# Patient Record
Sex: Male | Born: 1945 | Race: White | Hispanic: No | Marital: Married | Smoking: Former smoker
Health system: Southern US, Community
[De-identification: ages and names within clinical notes are randomized; demographics above are authoritative.]

## PROBLEM LIST (undated history)

## (undated) DIAGNOSIS — E785 Hyperlipidemia, unspecified: Secondary | ICD-10-CM

## (undated) DIAGNOSIS — K219 Gastro-esophageal reflux disease without esophagitis: Secondary | ICD-10-CM

## (undated) DIAGNOSIS — K4090 Unilateral inguinal hernia, without obstruction or gangrene, not specified as recurrent: Secondary | ICD-10-CM

## (undated) DIAGNOSIS — R001 Bradycardia, unspecified: Secondary | ICD-10-CM

## (undated) DIAGNOSIS — T7840XA Allergy, unspecified, initial encounter: Secondary | ICD-10-CM

## (undated) HISTORY — DX: Unilateral inguinal hernia, without obstruction or gangrene, not specified as recurrent: K40.90

## (undated) HISTORY — DX: Hyperlipidemia, unspecified: E78.5

## (undated) HISTORY — DX: Bradycardia, unspecified: R00.1

## (undated) HISTORY — DX: Allergy, unspecified, initial encounter: T78.40XA

## (undated) HISTORY — DX: Gastro-esophageal reflux disease without esophagitis: K21.9

## (undated) HISTORY — PX: HERNIA REPAIR: SHX51

---
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
## 1967-08-13 HISTORY — PX: TONSILLECTOMY: SUR1361

## 2007-07-30 ENCOUNTER — Encounter: Payer: Self-pay | Admitting: Pulmonary Disease

## 2008-03-04 ENCOUNTER — Encounter: Payer: Self-pay | Admitting: Pulmonary Disease

## 2008-03-10 ENCOUNTER — Encounter: Admission: RE | Admit: 2008-03-10 | Discharge: 2008-03-10 | Payer: Self-pay | Admitting: Family Medicine

## 2008-03-10 ENCOUNTER — Encounter: Payer: Self-pay | Admitting: Pulmonary Disease

## 2008-03-24 ENCOUNTER — Ambulatory Visit: Payer: Self-pay | Admitting: Pulmonary Disease

## 2008-03-24 DIAGNOSIS — R0602 Shortness of breath: Secondary | ICD-10-CM

## 2008-03-24 DIAGNOSIS — R05 Cough: Secondary | ICD-10-CM | POA: Insufficient documentation

## 2008-03-25 ENCOUNTER — Telehealth (INDEPENDENT_AMBULATORY_CARE_PROVIDER_SITE_OTHER): Payer: Self-pay | Admitting: *Deleted

## 2008-03-25 DIAGNOSIS — J309 Allergic rhinitis, unspecified: Secondary | ICD-10-CM | POA: Insufficient documentation

## 2008-03-25 DIAGNOSIS — E785 Hyperlipidemia, unspecified: Secondary | ICD-10-CM

## 2008-04-08 ENCOUNTER — Telehealth: Payer: Self-pay | Admitting: Pulmonary Disease

## 2008-04-15 ENCOUNTER — Ambulatory Visit: Payer: Self-pay | Admitting: Pulmonary Disease

## 2008-04-25 ENCOUNTER — Telehealth: Payer: Self-pay | Admitting: Pulmonary Disease

## 2009-11-02 IMAGING — CT CT CHEST W/ CM
2 of 3 series · 16 of 36 positions shown, 20 images · IV contrast (agent unspecified)
Comparison: None.

CLINICAL DATA: 61-year-old male progressive refractory cough

CT CHEST WITH CONTRAST
TECHNIQUE: Multidetector CT imaging of the chest was performed
following the standard protocol during bolus administration of
intravenous contrast.
Contrast: 75 ml [id number]

[Series 2: routine chest · axial · 0.70mm/px · z∈[-289,-14]mm · 13 of 63 slices shown, 17 images]
[im 5/63  mediastinal]
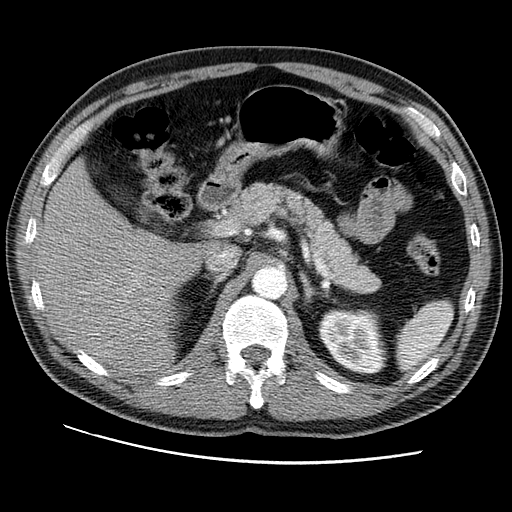
[im 5/63  lung]
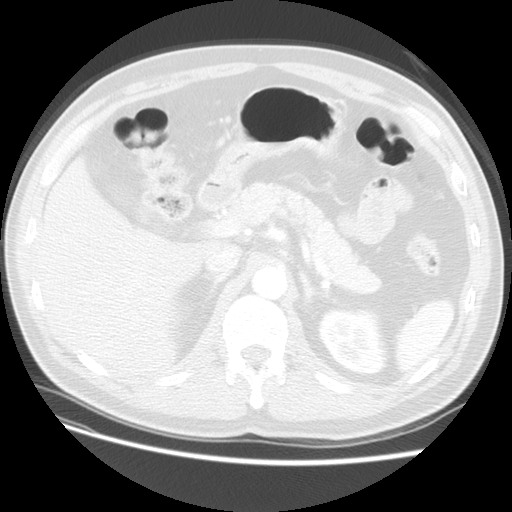
[im 10/63  lung]
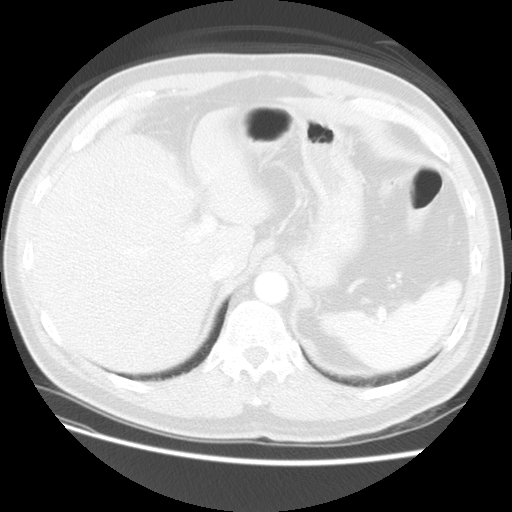
[im 14/63  lung]
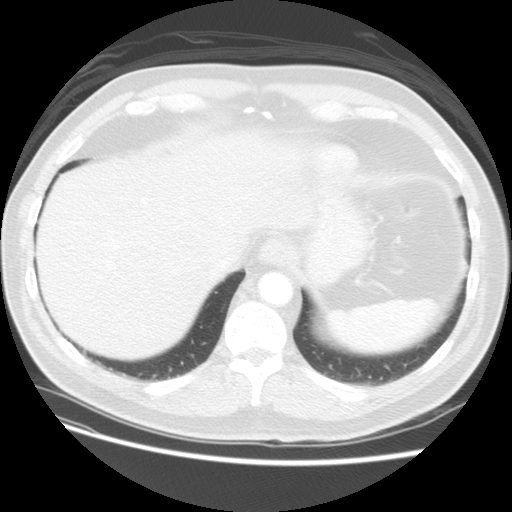
[im 19/63  lung]
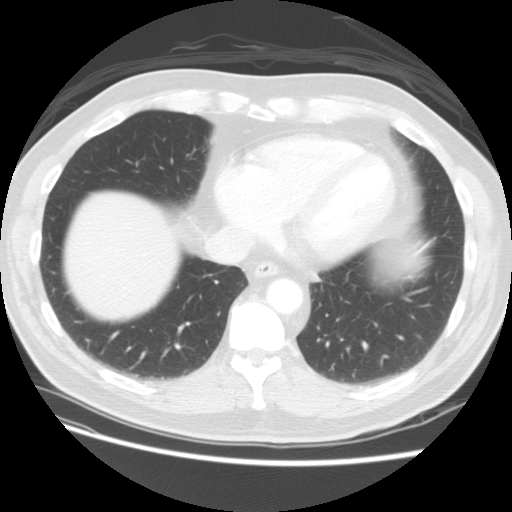
[im 23/63  mediastinal]
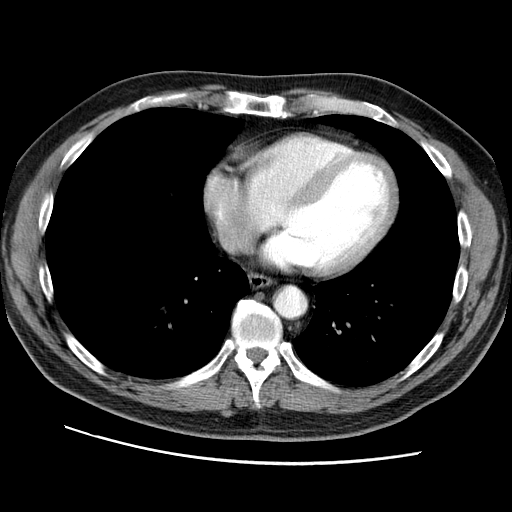
[im 23/63  lung]
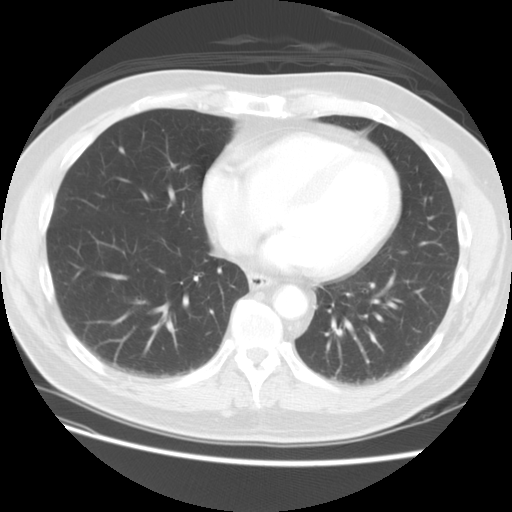
[im 28/63  lung]
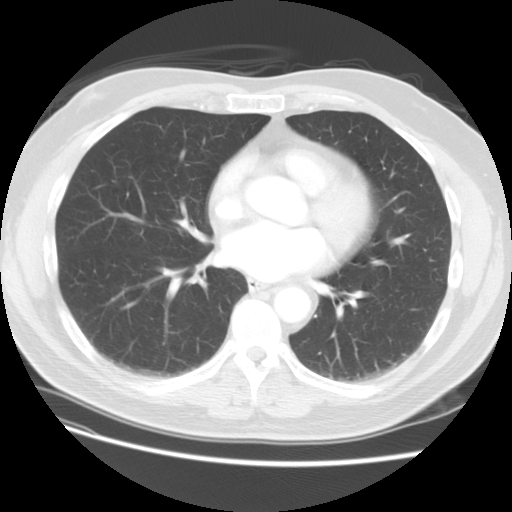
[im 33/63  lung]
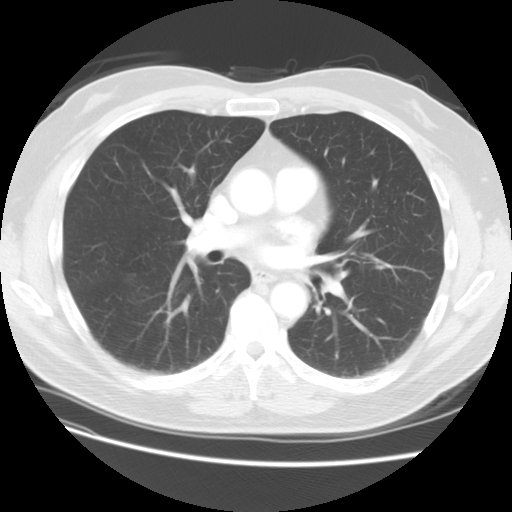
[im 37/63  lung]
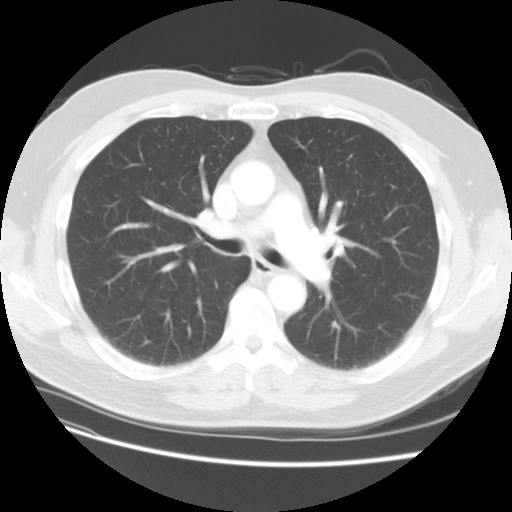
[im 42/63  mediastinal]
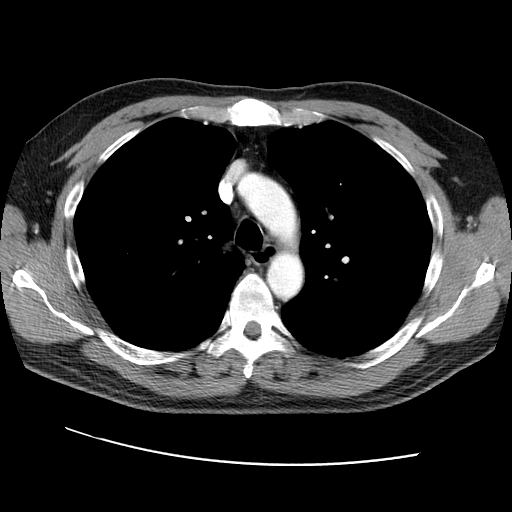
[im 42/63  lung]
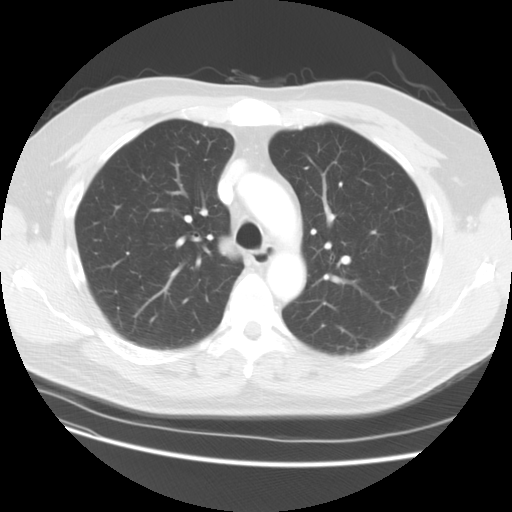
[im 46/63  lung]
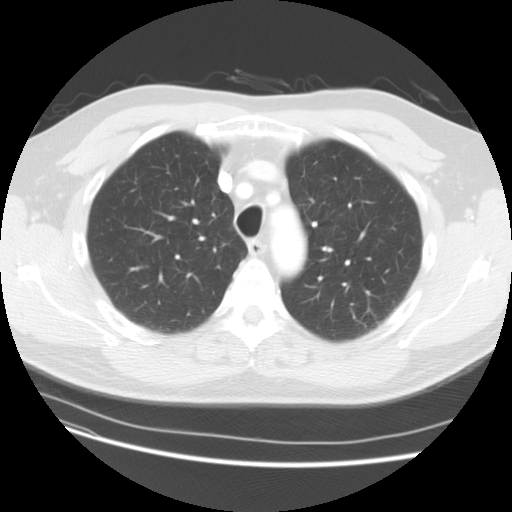
[im 51/63  lung]
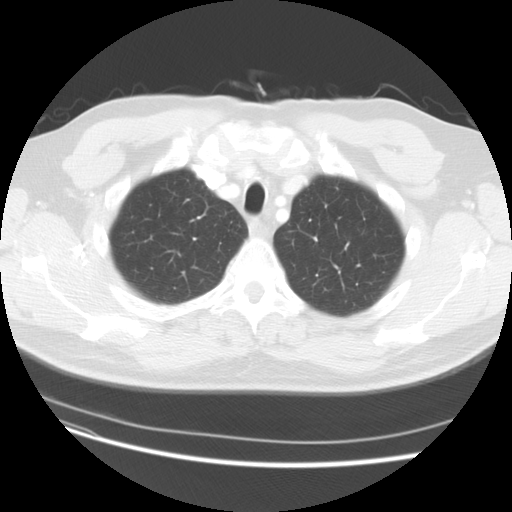
[im 56/63  lung]
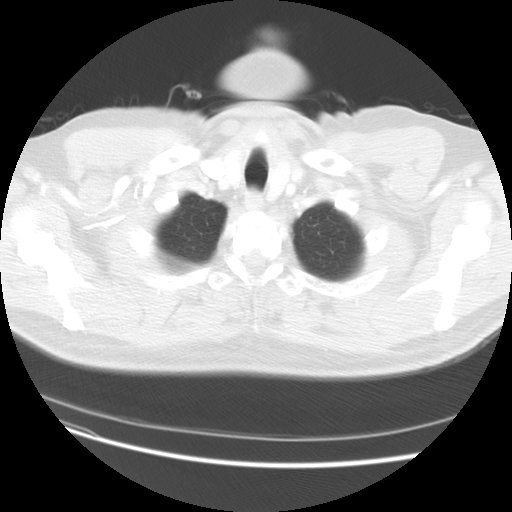
[im 60/63  mediastinal]
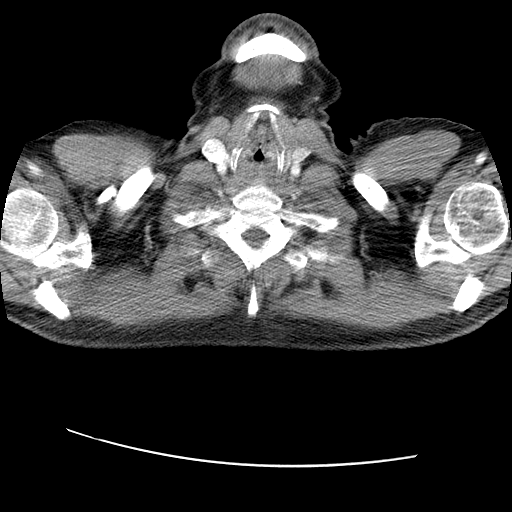
[im 60/63  lung]
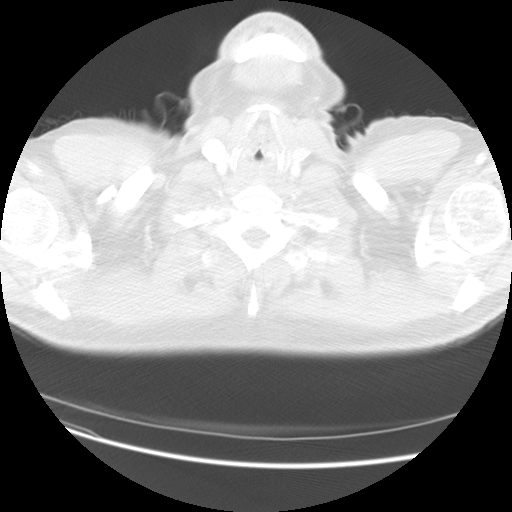

[Series 400: cor · coronal · 0.70mm/px · 3 of 100 slices shown]
[im 20/100  lung]
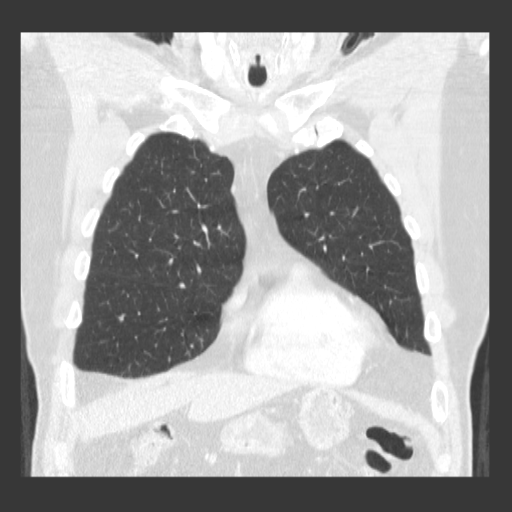
[im 40/100  lung]
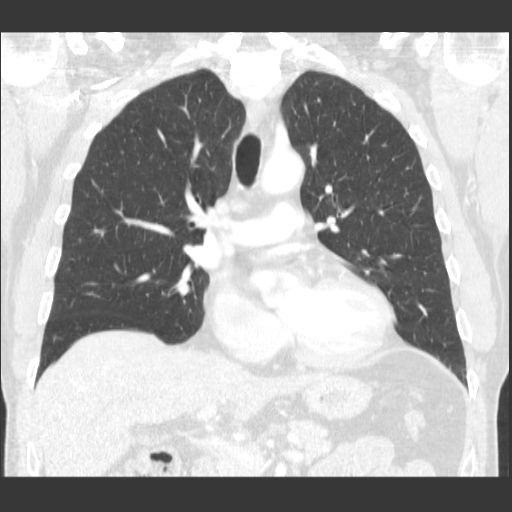
[im 60/100  lung]
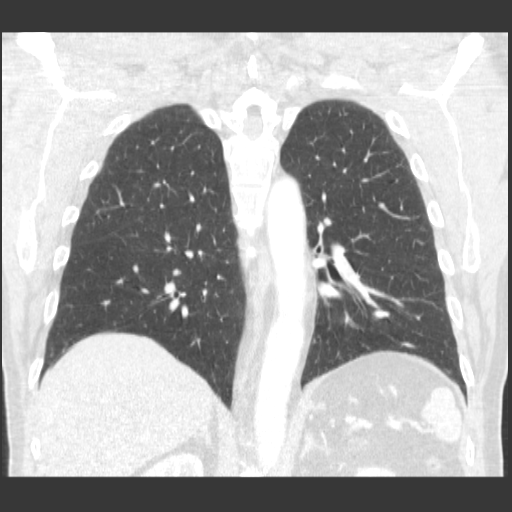

[16 of 36 positions shown; findings below may reference images not displayed]

FINDINGS: No axillary, mediastinal or hilar adenopathy.  Normal
heart size.  No pericardial or pleural effusion.  Intact thoracic
aorta.  No aneurysm or dissection.  Central enhanced pulmonary
arteries are patent.

Included imaging of the upper abdomen demonstrates no acute
finding.

Lung windows reveal no focal consolidation, airspace opacity,
edema, interstitial change, or airway abnormality.
IMPRESSION: No acute chest finding by CT.

## 2010-08-30 ENCOUNTER — Ambulatory Visit (HOSPITAL_COMMUNITY)
Admission: RE | Admit: 2010-08-30 | Discharge: 2010-08-30 | Payer: Self-pay | Source: Home / Self Care | Attending: Internal Medicine | Admitting: Internal Medicine

## 2010-10-24 ENCOUNTER — Ambulatory Visit
Admission: RE | Admit: 2010-10-24 | Discharge: 2010-10-24 | Disposition: A | Discharge status: Home / Self Care | Payer: Self-pay | Source: Ambulatory Visit | Attending: General Surgery | Admitting: General Surgery

## 2010-10-24 ENCOUNTER — Other Ambulatory Visit: Payer: Self-pay | Admitting: General Surgery

## 2010-10-24 DIAGNOSIS — Z01811 Encounter for preprocedural respiratory examination: Secondary | ICD-10-CM

## 2010-12-26 ENCOUNTER — Encounter (INDEPENDENT_AMBULATORY_CARE_PROVIDER_SITE_OTHER): Payer: Self-pay | Admitting: General Surgery

## 2012-04-23 IMAGING — US US MISC SOFT TISSUE
1 series · 14 of 16 positions shown · non-contrast
Comparison: None

CLINICAL DATA: Left inguinal bulge and discomfort, question hernia

LIMITED ULTRASOUND OF ABDOMINAL SOFT TISSUES
TECHNIQUE: Ultrasound examination of the abdominal wall soft
tissues was performed in the area of clinical concern.  I was
present for the study and examined the patient.

[Series 1: us misc soft tissue · 0.07mm/px · 18 acquisitions, 14 frames shown]
[im 1/18]
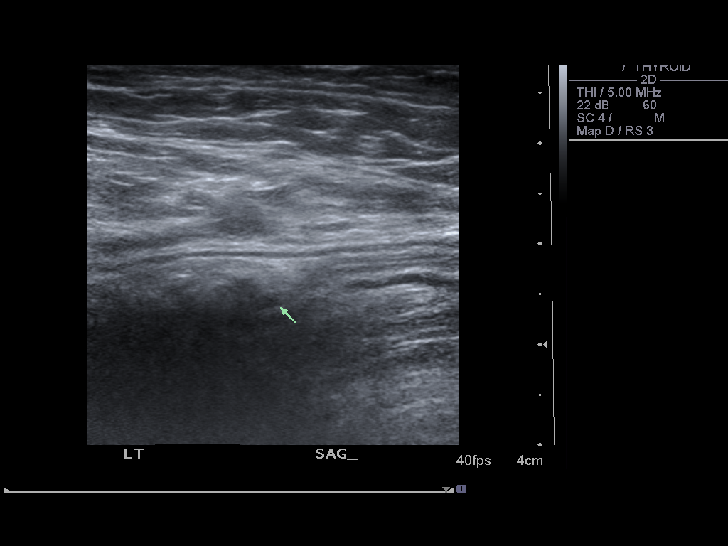
[im 2/18]
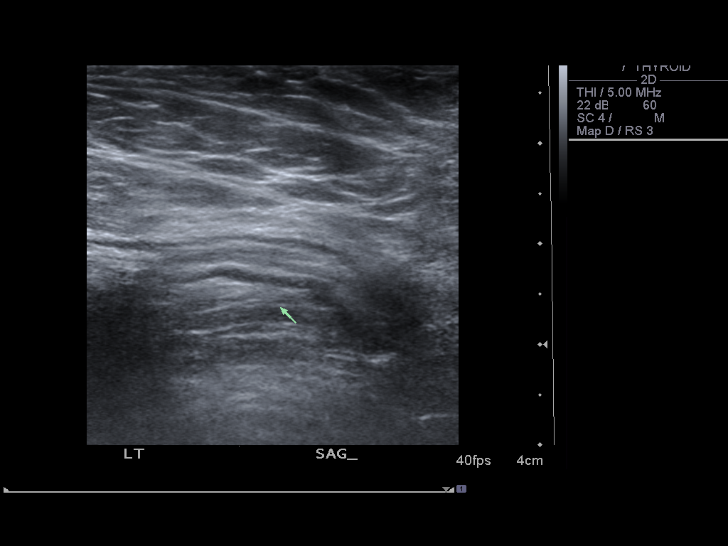
[im 3/18]
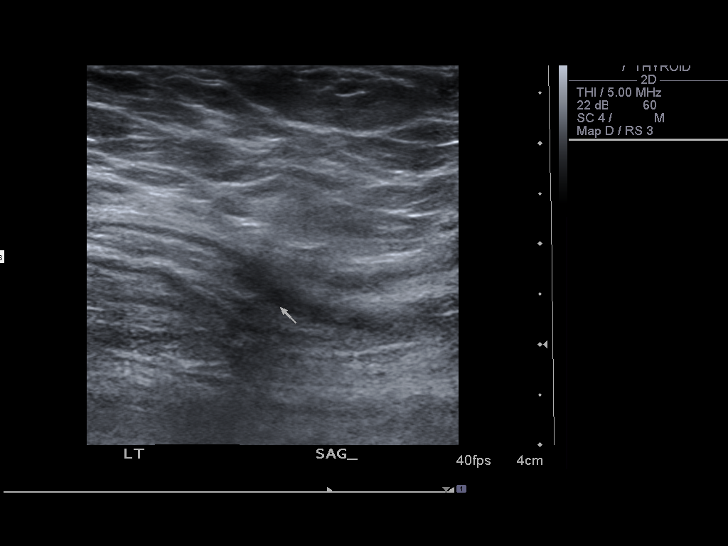
[im 5/18]
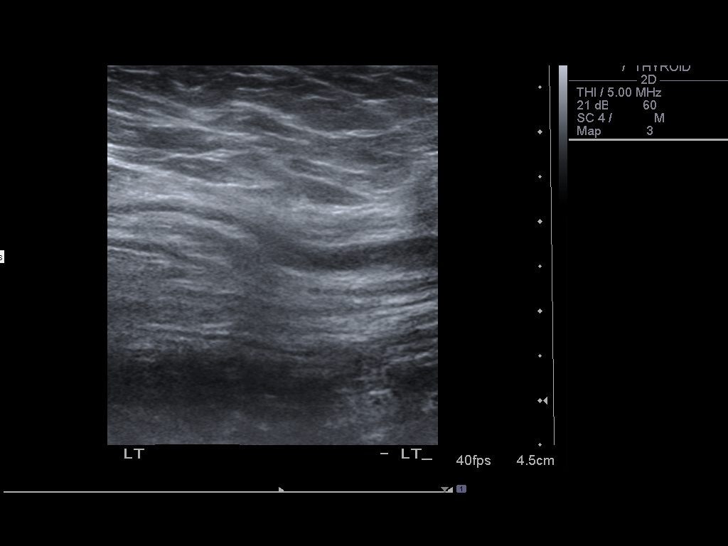
[im 6/18]
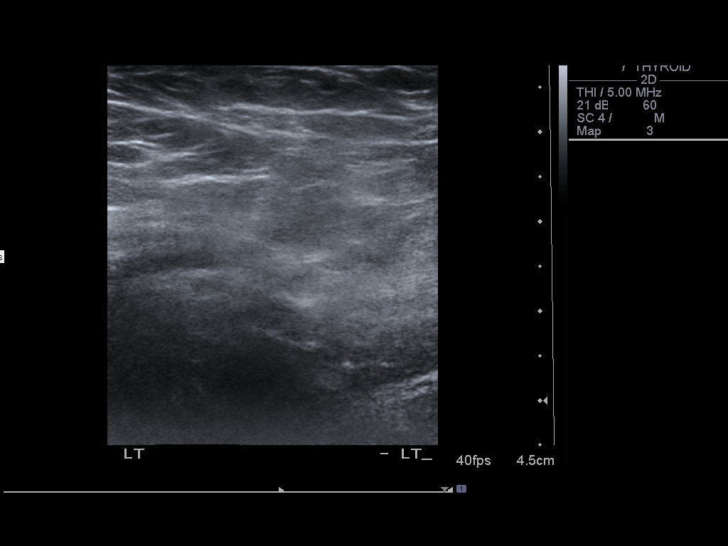
[im 7/18]
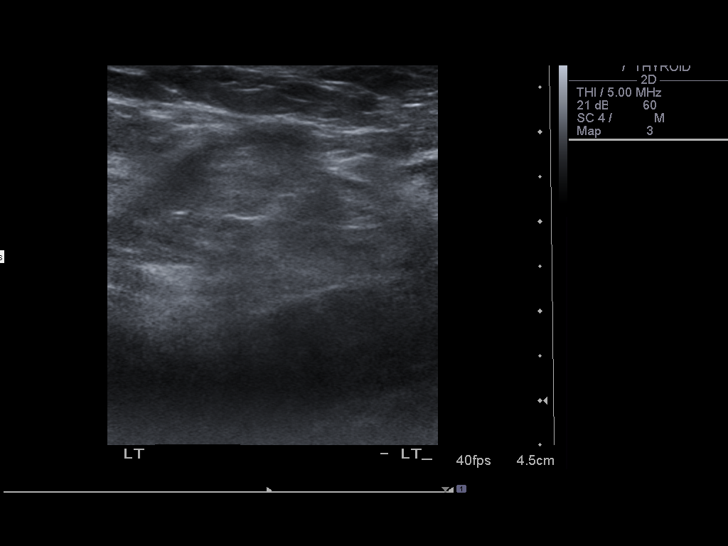
[im 8/18]
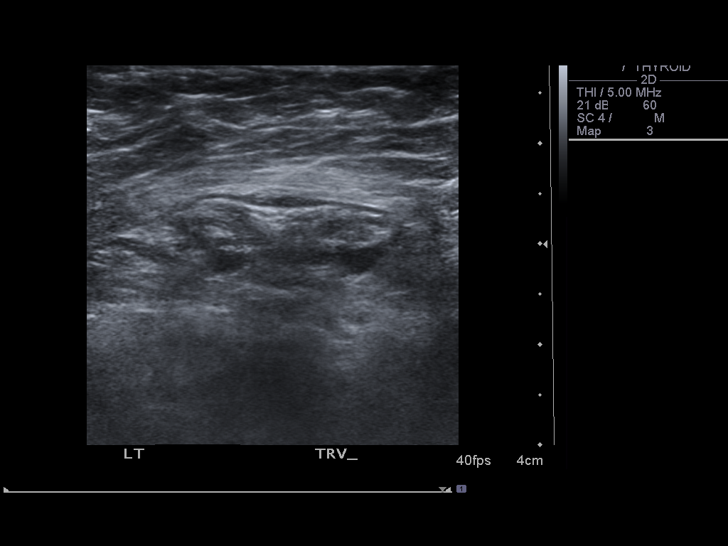
[im 10/18]
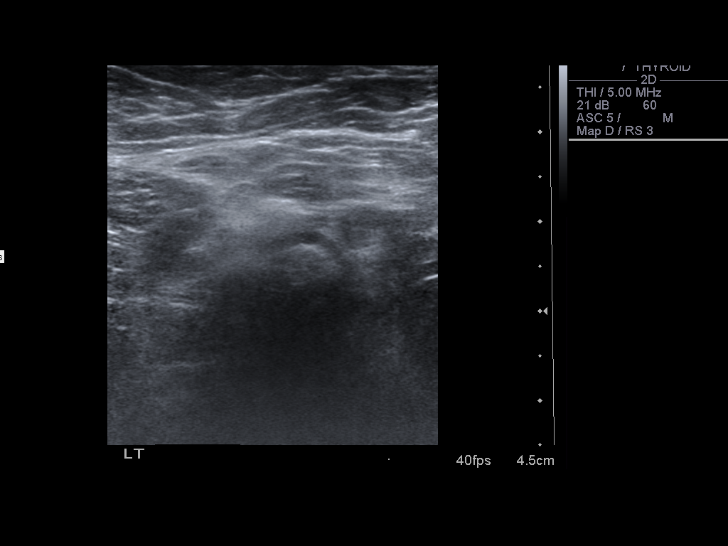
[im 11/18]
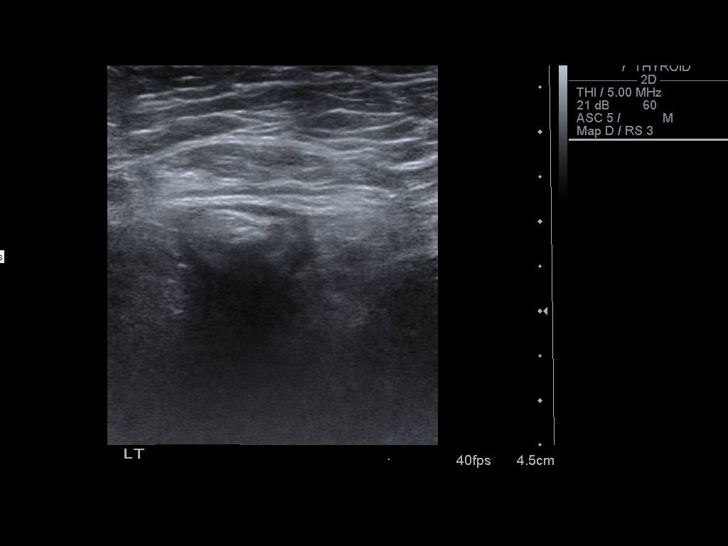
[im 12/18]
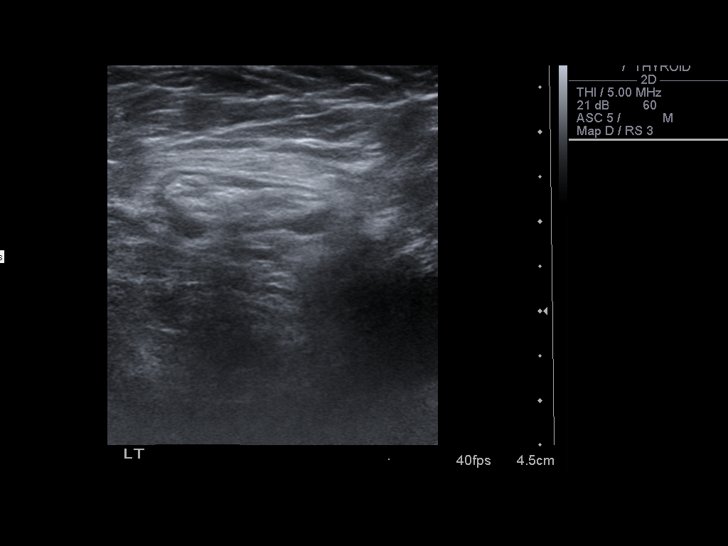
[im 14/18]
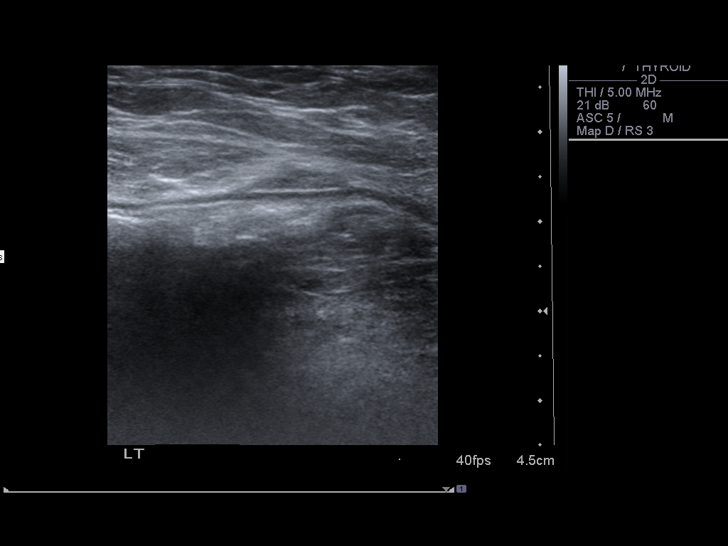
[im 15/18]
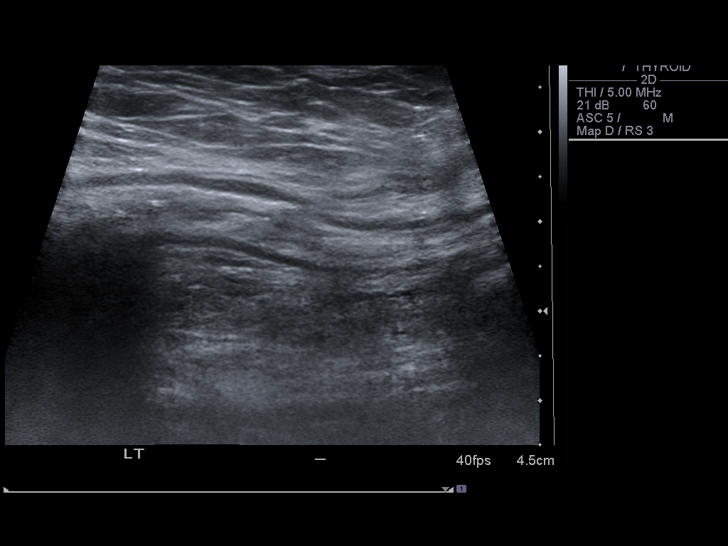
[im 16/18]
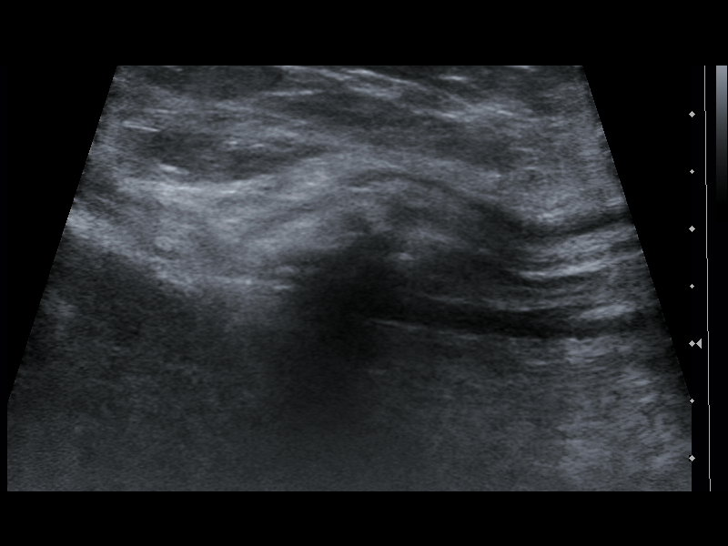
[im 18/18]
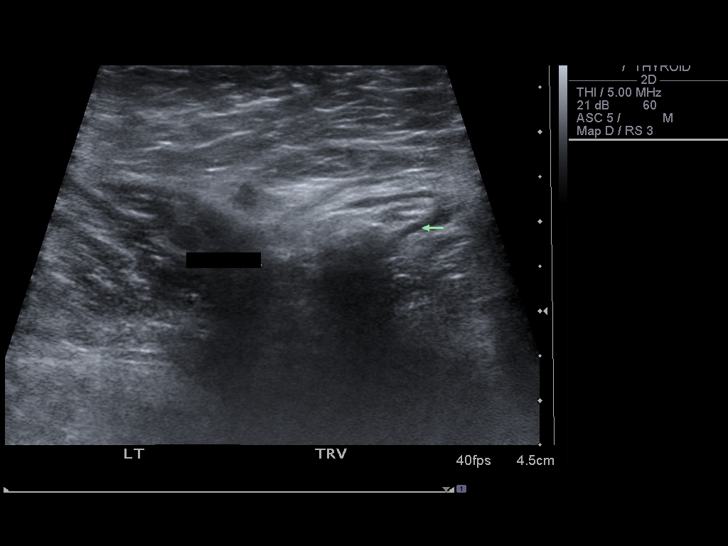

[14 of 16 positions shown; findings below may reference images not displayed]

FINDINGS: At the site of clinical concern, a segment of nonspecific fatty and
echogenicity with linear orientation is identified, corresponding
to the course of the left inguinal canal.
With bearing down, this fat is mobile and descends towards the
scrotum, while remaining soft tissues remain fixed.
This appears to represent an indirect left inguinal hernia.
Indirect left inguinal hernia is present on physical exam.
No bowel herniation identified.
No focal fascial defect or area of soft tissue protrusion is
identified to suggest a direct inguinal hernia.
IMPRESSION: Indirect left inguinal hernia containing fat.

## 2015-10-04 DIAGNOSIS — E782 Mixed hyperlipidemia: Secondary | ICD-10-CM | POA: Diagnosis not present

## 2015-10-04 DIAGNOSIS — Z125 Encounter for screening for malignant neoplasm of prostate: Secondary | ICD-10-CM | POA: Diagnosis not present

## 2015-10-06 DIAGNOSIS — K219 Gastro-esophageal reflux disease without esophagitis: Secondary | ICD-10-CM | POA: Diagnosis not present

## 2015-10-06 DIAGNOSIS — E782 Mixed hyperlipidemia: Secondary | ICD-10-CM | POA: Diagnosis not present

## 2016-01-15 ENCOUNTER — Telehealth: Payer: Self-pay

## 2016-01-15 NOTE — Telephone Encounter (Signed)
[phone]  PLEASE CALL PATIENT TO SCHEDULE TCS,  PCP DR Nevada Crane

## 2016-01-17 ENCOUNTER — Telehealth: Payer: Self-pay

## 2016-01-17 NOTE — Telephone Encounter (Signed)
Triaged pt today.

## 2016-01-23 ENCOUNTER — Other Ambulatory Visit: Payer: Self-pay

## 2016-01-23 DIAGNOSIS — Z1211 Encounter for screening for malignant neoplasm of colon: Secondary | ICD-10-CM

## 2016-01-23 NOTE — Telephone Encounter (Signed)
Gastroenterology Pre-Procedure Review  Request Date: 02/26/2016 Requesting Physician: Dr. Nevada Crane  PATIENT REVIEW QUESTIONS: The patient responded to the following health history questions as indicated:    1. Diabetes Melitis: no 2. Joint replacements in the past 12 months: no 3. Major health problems in the past 3 months: no 4. Has an artificial valve or MVP: no 5. Has a defibrillator: no 6. Has been advised in past to take antibiotics in advance of a procedure like teeth cleaning: no 7. Family history of colon cancer: no  8. Alcohol Use: no 9. History of sleep apnea: no     MEDICATIONS & ALLERGIES:    Patient reports the following regarding taking any blood thinners:   Plavix? no Aspirin? no Coumadin? no  Patient confirms/reports the following medications:  Current Outpatient Prescriptions  Medication Sig Dispense Refill  . Calcium Carbonate-Vitamin D (CALCIUM + D PO) Take by mouth.      Mariane Baumgarten Calcium (STOOL SOFTENER PO) Take by mouth.      . fenofibrate 160 MG tablet Take 160 mg by mouth daily.    . fexofenadine (ALLEGRA) 180 MG tablet Take 180 mg by mouth daily.    . Glucosamine-Chondroitin 1500-1200 MG/30ML LIQD Take by mouth.      Marland Kitchen LYCOPENE PO Take by mouth.    . Multiple Vitamin (MULTIVITAMIN) capsule Take 1 capsule by mouth daily.      . Omega-3 Fatty Acids (FISH OIL) 1000 MG CAPS Take by mouth. Takes 2 tablets daily    . omeprazole (PRILOSEC) 20 MG capsule Take 20 mg by mouth daily. Takes 40 mg once daily    . polycarbophil (FIBERCON) 625 MG tablet Take 625 mg by mouth daily.      . Probiotic Product (PROBIOTIC PO) Take by mouth.      . Ascorbic Acid (VITAMIN C) 1000 MG tablet Take 1,000 mg by mouth daily. Reported on 01/17/2016    . aspirin 81 MG tablet Take 81 mg by mouth daily. Reported on 01/17/2016    . [place] 450 MG CAPS Take by mouth. Reported on 01/17/2016    . niacin 50 MG tablet Take 50 mg by mouth daily with breakfast. Reported on 01/17/2016    .  pravastatin (PRAVACHOL) 20 MG tablet Take 20 mg by mouth daily. Reported on 01/17/2016     No current facility-administered medications for this visit.    Patient confirms/reports the following allergies:  No Known Allergies  No orders of the defined types were placed in this encounter.    AUTHORIZATION INFORMATION Primary Insurance:   ID #:   Group #:  Pre-Cert / Auth required:  Pre-Cert / Auth #:   Secondary Insurance:   ID #:   Group #:  Pre-Cert / Auth required:  Pre-Cert / Auth #:   SCHEDULE INFORMATION: Procedure has been scheduled as follows:  Date: 02/26/2016    Time:  8:30 Am Location:  [facility] Short Stay  This Gastroenterology Pre-Precedure Review Form is being routed to the following provider(s): Barney Drain, MD

## 2016-01-23 NOTE — Telephone Encounter (Signed)
SUPREP SPLIT DOSING- CLEAR LIQUIDS WITH BREAKFAST.  

## 2016-01-25 MED ORDER — PEG 3350-KCL-NA BICARB-NACL 420 G PO SOLR: 4000.0000 mL | ORAL | Status: DC

## 2016-01-25 MED ORDER — NA SULFATE-K SULFATE-MG SULF 17.5-3.13-1.6 GM/177ML PO SOLN: 1.0000 | ORAL | Status: DC

## 2016-01-25 NOTE — Telephone Encounter (Signed)
Suprep too expensive, sending in Martin. Mailing instructions.

## 2016-01-25 NOTE — Telephone Encounter (Signed)
REVIEWED-NO ADDITIONAL RECOMMENDATIONS. 

## 2016-02-15 NOTE — Telephone Encounter (Signed)
No PA is needed pre automated call

## 2016-02-22 ENCOUNTER — Telehealth: Payer: Self-pay

## 2016-02-22 DIAGNOSIS — X32XXXD Exposure to sunlight, subsequent encounter: Secondary | ICD-10-CM | POA: Diagnosis not present

## 2016-02-22 DIAGNOSIS — D485 Neoplasm of uncertain behavior of skin: Secondary | ICD-10-CM | POA: Diagnosis not present

## 2016-02-22 DIAGNOSIS — C44222 Squamous cell carcinoma of skin of right ear and external auricular canal: Secondary | ICD-10-CM | POA: Diagnosis not present

## 2016-02-22 DIAGNOSIS — D225 Melanocytic nevi of trunk: Secondary | ICD-10-CM | POA: Diagnosis not present

## 2016-02-22 DIAGNOSIS — L570 Actinic keratosis: Secondary | ICD-10-CM | POA: Diagnosis not present

## 2016-02-22 DIAGNOSIS — Z1283 Encounter for screening for malignant neoplasm of skin: Secondary | ICD-10-CM | POA: Diagnosis not present

## 2016-02-22 DIAGNOSIS — D2239 Melanocytic nevi of other parts of face: Secondary | ICD-10-CM | POA: Diagnosis not present

## 2016-02-22 NOTE — Telephone Encounter (Signed)
I called pt to update triage and he has not had any change in meds since he was triaged.

## 2016-02-26 ENCOUNTER — Encounter (HOSPITAL_COMMUNITY): Payer: Self-pay | Admitting: *Deleted

## 2016-02-26 ENCOUNTER — Encounter (HOSPITAL_COMMUNITY)
Admission: RE | Disposition: A | Discharge status: Home / Self Care | Payer: Self-pay | Source: Ambulatory Visit | Attending: Gastroenterology

## 2016-02-26 ENCOUNTER — Ambulatory Visit (HOSPITAL_COMMUNITY)
Admission: RE | Admit: 2016-02-26 | Discharge: 2016-02-26 | Disposition: A | Discharge status: Home / Self Care | Payer: PPO | Source: Ambulatory Visit | Attending: Gastroenterology | Admitting: Gastroenterology

## 2016-02-26 DIAGNOSIS — D123 Benign neoplasm of transverse colon: Secondary | ICD-10-CM | POA: Insufficient documentation

## 2016-02-26 DIAGNOSIS — Z1211 Encounter for screening for malignant neoplasm of colon: Secondary | ICD-10-CM | POA: Diagnosis not present

## 2016-02-26 DIAGNOSIS — K648 Other hemorrhoids: Secondary | ICD-10-CM | POA: Diagnosis not present

## 2016-02-26 DIAGNOSIS — Q438 Other specified congenital malformations of intestine: Secondary | ICD-10-CM | POA: Insufficient documentation

## 2016-02-26 DIAGNOSIS — K5730 Diverticulosis of large intestine without perforation or abscess without bleeding: Secondary | ICD-10-CM | POA: Insufficient documentation

## 2016-02-26 DIAGNOSIS — Z87891 Personal history of nicotine dependence: Secondary | ICD-10-CM | POA: Diagnosis not present

## 2016-02-26 HISTORY — PX: COLONOSCOPY: SHX5424

## 2016-02-26 SURGERY — COLONOSCOPY
Anesthesia: Moderate Sedation

## 2016-02-26 MED ORDER — MEPERIDINE HCL 100 MG/ML IJ SOLN
INTRAMUSCULAR | Status: AC
  Filled 2016-02-26: qty 2

## 2016-02-26 MED ORDER — MIDAZOLAM HCL 5 MG/5ML IJ SOLN
INTRAMUSCULAR | Status: AC
  Filled 2016-02-26: qty 10

## 2016-02-26 MED ORDER — MIDAZOLAM HCL 5 MG/5ML IJ SOLN
INTRAMUSCULAR | Status: DC | PRN
  Administered 2016-02-26: 1 mg via INTRAVENOUS
  Administered 2016-02-26 (×2): 2 mg via INTRAVENOUS

## 2016-02-26 MED ORDER — MEPERIDINE HCL 100 MG/ML IJ SOLN
INTRAMUSCULAR | Status: DC | PRN
  Administered 2016-02-26 (×3): 25 mg via INTRAVENOUS

## 2016-02-26 MED ORDER — STERILE WATER FOR IRRIGATION IR SOLN
Status: DC | PRN
  Administered 2016-02-26: 09:00:00

## 2016-02-26 MED ORDER — SODIUM CHLORIDE 0.9 % IV SOLN
INTRAVENOUS | Status: DC
  Administered 2016-02-26: 08:00:00 via INTRAVENOUS

## 2016-02-26 NOTE — Op Note (Signed)
[facility] Patient Name: David Randall Procedure Date: 02/26/2016 8:24 AM MRN: [id number] Date of Birth: 19-May-1946 Attending MD: Barney Drain , MD CSN: [id number] Age: 70 Admit Type: Outpatient Procedure:                Colonoscopy WITH COLD FORCEPS POLYPECTOMY Indications:              Screening for colorectal malignant neoplasm Providers:                Barney Drain, MD, Janeece Riggers, RN, Isabella Stalling,                            Technician Referring MD:             Delphina Cahill, MD Medicines:                Meperidine 75 mg IV, Midazolam 5 mg IV Complications:            No immediate complications. Estimated Blood Loss:     Estimated blood loss was minimal. Procedure:                Pre-Anesthesia Assessment:                           - Prior to the procedure, a History and Physical                            was performed, and patient medications and                            allergies were reviewed. The patient's tolerance of                            previous anesthesia was also reviewed. The risks                            and benefits of the procedure and the sedation                            options and risks were discussed with the patient.                            All questions were answered, and informed consent                            was obtained. Prior Anticoagulants: The patient has                            taken no previous anticoagulant or antiplatelet                            agents. ASA Grade Assessment: II - A patient with                            mild systemic disease. After reviewing the risks  and benefits, the patient was deemed in                            satisfactory condition to undergo the procedure.                           After obtaining informed consent, the colonoscope                            was passed under direct vision. Throughout the                            procedure, the patient's blood  pressure, pulse, and                            oxygen saturations were monitored continuously. The                            [id number]) was introduced through the anus                            and advanced to the the cecum, identified by                            appendiceal orifice and ileocecal valve. The                            colonoscopy was somewhat difficult due to a                            tortuous colon. Successful completion of the                            procedure was aided by COLOWRAP. The patient                            tolerated the procedure well. The quality of the                            bowel preparation was excellent. The ileocecal                            valve, appendiceal orifice, and rectum were                            photographed. Scope In: 8:50:35 AM Scope Out: 9:07:09 AM Scope Withdrawal Time: 0 hours 11 minutes 47 seconds  Total Procedure Duration: 0 hours 16 minutes 34 seconds  Findings:      A 3 mm polyp was found in the proximal transverse colon. The polyp was       sessile. The polyp was removed with a cold biopsy forceps. Resection and       retrieval were complete.      A few small-mouthed diverticula were found in the sigmoid colon and  descending colon.      Non-bleeding external and internal hemorrhoids were found.      The recto-sigmoid colon and sigmoid colon were moderately redundant AND       COLOWRAP Impression:               - One 3 mm polyp in the proximal transverse colon,                            removed with a cold biopsy forceps.                           - Diverticulosis in the sigmoid colon and in the                            descending colon.                           - Non-bleeding LARGE external and SMALL internal                            hemorrhoids.                           - MODERATELY Redundant LEFT colon. Moderate Sedation:      Moderate (conscious) sedation was administered by the  endoscopy nurse       and supervised by the endoscopist. The following parameters were       monitored: oxygen saturation, heart rate, blood pressure, and response       to care. Total physician intraservice time was 31 minutes. Recommendation:           - High fiber diet.                           - Continue present medications.                           - Await pathology results.                           - Repeat colonoscopy in 5-10 years for surveillance.                           PREPARATION H PRN FOR HEMORRHOIDS. RESERVE HCT                            CREAM FOR FLARES. EXPLAINED BENEFITS AND RISKS OF                            LONG TERM HCT CREAM USE. PT AND WIFE VOICED                            UNDERSTANDING.                           - Patient has a contact number available for  emergencies. The signs and symptoms of potential                            delayed complications were discussed with the                            patient. Return to normal activities tomorrow.                            Written discharge instructions were provided to the                            patient. Procedure Code(s):        --- Professional ---                           [phone], Colonoscopy, flexible; with biopsy, single                            or multiple                           99152, Moderate sedation services provided by the                            same physician or other qualified health care                            professional performing the diagnostic or                            therapeutic service that the sedation supports,                            requiring the presence of an independent trained                            observer to assist in the monitoring of the                            patient's level of consciousness and physiological                            status; initial 15 minutes of intraservice time,                             patient age 64 years or older                           [id number], Moderate sedation services; each additional                            15 minutes intraservice time Diagnosis Code(s):        --- Professional ---  Z12.11, Encounter for screening for malignant                            neoplasm of colon                           D12.3, Benign neoplasm of transverse colon (hepatic                            flexure or splenic flexure)                           K64.8, Other hemorrhoids                           K57.30, Diverticulosis of large intestine without                            perforation or abscess without bleeding                           Q43.8, Other specified congenital malformations of                            intestine CPT copyright 2016 [employer]. All rights reserved. The codes documented in this report are preliminary and upon coder review may  be revised to meet current compliance requirements. Barney Drain, MD Barney Drain, MD 02/26/2016 9:47:23 AM This report has been signed electronically. Number of Addenda: 0

## 2016-02-26 NOTE — Discharge Instructions (Signed)
You have small internal AND LARGE EXTERNAL hemorrhoids and diverticulosis IN YOUR LEFT COLON. YOU HAD ONE SMALL POLYP REMOVED FROM YOUR RIGHT COLON.   DRINK WATER TO KEEP YOUR URINE LIGHT YELLOW.  FOLLOW A HIGH FIBER DIET. AVOID ITEMS THAT CAUSE BLOATING. See info below.  YOUR BIOPSY RESULTS WILL BE AVAILABLE IN MY CHART JUL 20 AND  OR MY OFFICE WILL CONTACT YOU IN 10-14 DAYS WITH YOUR RESULTS.   Next colonoscopy in 5-10 years.  Colonoscopy Care After Read the instructions outlined below and refer to this sheet in the next week. These discharge instructions provide you with general information on caring for yourself after you leave the hospital. While your treatment has been planned according to the most current medical practices available, unavoidable complications occasionally occur. If you have any problems or questions after discharge, call DR. Kensley Valladares, [phone].  ACTIVITY  You may resume your regular activity, but move at a slower pace for the next 24 hours.   Take frequent rest periods for the next 24 hours.   Walking will help get rid of the air and reduce the bloated feeling in your belly (abdomen).   No driving for 24 hours (because of the medicine (anesthesia) used during the test).   You may shower.   Do not sign any important legal documents or operate any machinery for 24 hours (because of the anesthesia used during the test).    NUTRITION  Drink plenty of fluids.   You may resume your normal diet as instructed by your doctor.   Begin with a light meal and progress to your normal diet. Heavy or fried foods are harder to digest and may make you feel sick to your stomach (nauseated).   Avoid alcoholic beverages for 24 hours or as instructed.    MEDICATIONS  You may resume your normal medications.   WHAT YOU CAN EXPECT TODAY  Some feelings of bloating in the abdomen.   Passage of more gas than usual.   Spotting of blood in your stool or on the toilet  paper  .  IF YOU HAD POLYPS REMOVED DURING THE COLONOSCOPY:  Eat a soft diet IF YOU HAVE NAUSEA, BLOATING, ABDOMINAL PAIN, OR VOMITING.    FINDING OUT THE RESULTS OF YOUR TEST Not all test results are available during your visit. DR. Oneida Alar WILL CALL YOU WITHIN 14 DAYS OF YOUR PROCEDUE WITH YOUR RESULTS. Do not assume everything is normal if you have not heard from DR. Tyquon Near, CALL HER OFFICE AT [phone].  SEEK IMMEDIATE MEDICAL ATTENTION AND CALL THE OFFICE: [phone] IF:  You have more than a spotting of blood in your stool.   Your belly is swollen (abdominal distention).   You are nauseated or vomiting.   You have a temperature over 101F.   You have abdominal pain or discomfort that is severe or gets worse throughout the day.  High-Fiber Diet A high-fiber diet changes your normal diet to include more whole grains, legumes, fruits, and vegetables. Changes in the diet involve replacing refined carbohydrates with unrefined foods. The calorie level of the diet is essentially unchanged. The Dietary Reference Intake (recommended amount) for adult males is 38 grams per day. For adult females, it is 25 grams per day. Pregnant and lactating women should consume 28 grams of fiber per day. Fiber is the intact part of a plant that is not broken down during digestion. Functional fiber is fiber that has been isolated from the plant to provide a beneficial effect in  the body. PURPOSE  Increase stool bulk.   Ease and regulate bowel movements.   Lower cholesterol.  REDUCE RISK OF COLON CANCER  INDICATIONS THAT YOU NEED MORE FIBER  Constipation and hemorrhoids.   Uncomplicated diverticulosis (intestine condition) and irritable bowel syndrome.   Weight management.   As a protective measure against hardening of the arteries (atherosclerosis), diabetes, and cancer.   GUIDELINES FOR INCREASING FIBER IN THE DIET  Start adding fiber to the diet slowly. A gradual increase of about 5  more grams (2 slices of whole-wheat bread, 2 servings of most fruits or vegetables, or 1 bowl of high-fiber cereal) per day is best. Too rapid an increase in fiber may result in constipation, flatulence, and bloating.   Drink enough water and fluids to keep your urine clear or pale yellow. Water, juice, or caffeine-free drinks are recommended. Not drinking enough fluid may cause constipation.   Eat a variety of high-fiber foods rather than one type of fiber.   Try to increase your intake of fiber through using high-fiber foods rather than fiber pills or supplements that contain small amounts of fiber.   The goal is to change the types of food eaten. Do not supplement your present diet with high-fiber foods, but replace foods in your present diet.   INCLUDE A VARIETY OF FIBER SOURCES  Replace refined and processed grains with whole grains, canned fruits with fresh fruits, and incorporate other fiber sources. White rice, white breads, and most bakery goods contain little or no fiber.   Brown whole-grain rice, buckwheat oats, and many fruits and vegetables are all good sources of fiber. These include: broccoli, Brussels sprouts, cabbage, cauliflower, beets, sweet potatoes, white potatoes (skin on), carrots, tomatoes, eggplant, squash, berries, fresh fruits, and dried fruits.   Cereals appear to be the richest source of fiber. Cereal fiber is found in whole grains and bran. Bran is the fiber-rich outer coat of cereal grain, which is largely removed in refining. In whole-grain cereals, the bran remains. In breakfast cereals, the largest amount of fiber is found in those with "bran" in their names. The fiber content is sometimes indicated on the label.   You may need to include additional fruits and vegetables each day.   In baking, for 1 cup white flour, you may use the following substitutions:   1 cup whole-wheat flour minus 2 tablespoons.   1/2 cup white flour plus 1/2 cup whole-wheat flour.    Polyps, Colon  A polyp is extra tissue that grows inside your body. Colon polyps grow in the large intestine. The large intestine, also called the colon, is part of your digestive system. It is a long, hollow tube at the end of your digestive tract where your body makes and stores stool. Most polyps are not dangerous. They are benign. This means they are not cancerous. But over time, some types of polyps can turn into cancer. Polyps that are smaller than a pea are usually not harmful. But larger polyps could someday become or may already be cancerous. To be safe, doctors remove all polyps and test them.   PREVENTION There is not one sure way to prevent polyps. You might be able to lower your risk of getting them if you:  Eat more fruits and vegetables and less fatty food.   Do not smoke.   Avoid alcohol.   Exercise every day.   Lose weight if you are overweight.   Eating more calcium and folate can also lower your risk  of getting polyps. Some foods that are rich in calcium are milk, cheese, and broccoli. Some foods that are rich in folate are chickpeas, kidney beans, and spinach.    Diverticulosis Diverticulosis is a common condition that develops when small pouches (diverticula) form in the wall of the colon. The risk of diverticulosis increases with age. It happens more often in people who eat a low-fiber diet. Most individuals with diverticulosis have no symptoms. Those individuals with symptoms usually experience belly (abdominal) pain, constipation, or loose stools (diarrhea).  HOME CARE INSTRUCTIONS  Increase the amount of fiber in your diet as directed by your caregiver or dietician. This may reduce symptoms of diverticulosis.   Drink at least 6 to 8 glasses of water each day to prevent constipation.   Try not to strain when you have a bowel movement.   Avoiding nuts and seeds to prevent complications is NOT NECESSARY.   FOODS HAVING HIGH FIBER CONTENT INCLUDE:  Fruits.  Apple, peach, pear, tangerine, raisins, prunes.   Vegetables. Brussels sprouts, asparagus, broccoli, cabbage, carrot, cauliflower, romaine lettuce, spinach, summer squash, tomato, winter squash, zucchini.   Starchy Vegetables. Baked beans, kidney beans, lima beans, split peas, lentils, potatoes (with skin).   Grains. Whole wheat bread, brown rice, bran flake cereal, plain oatmeal, white rice, shredded wheat, bran muffins.    SEEK IMMEDIATE MEDICAL CARE IF:  You develop increasing pain or severe bloating.   You have an oral temperature above 101F.   You develop vomiting or bowel movements that are bloody or black.   Hemorrhoids Hemorrhoids are dilated (enlarged) veins around the rectum. Sometimes clots will form in the veins. This makes them swollen and painful. These are called thrombosed hemorrhoids. Causes of hemorrhoids include:  Constipation.   Straining to have a bowel movement.   HEAVY LIFTING  HOME CARE INSTRUCTIONS  Eat a well balanced diet and drink 6 to 8 glasses of water every day to avoid constipation. You may also use a bulk laxative.   Avoid straining to have bowel movements.   Keep anal area dry and clean.   Do not use a donut shaped pillow or sit on the toilet for long periods. This increases blood pooling and pain.   Move your bowels when your body has the urge; this will require less straining and will decrease pain and pressure.

## 2016-02-26 NOTE — H&P (Signed)
Primary Care Physician:  Wende Neighbors, MD Primary Gastroenterologist:  Dr. Oneida Alar  Pre-Procedure History & Physical: HPI:  David Randall is a 70 y.o. male here for [place].  Past Medical History  Diagnosis Date  . Acid reflux   . Low HDL (under 40)   . Hernia   . Hemorrhoids     Past Surgical History  Procedure Laterality Date  . Tonsillectomy  1969  . Hernia repair      Prior to Admission medications   Medication Sig Start Date End Date Taking? Authorizing Provider  Ascorbic Acid (VITAMIN C) 1000 MG tablet Take 1,000 mg by mouth daily. Reported on 01/17/2016   Yes Historical Provider, MD  Calcium Carbonate-Vitamin D (CALCIUM + D PO) Take by mouth.     Yes Historical Provider, MD  Cascara Sagrada 450 MG CAPS Take by mouth. Reported on 01/17/2016   Yes Historical Provider, MD  Docusate Calcium (STOOL SOFTENER PO) Take by mouth.     Yes Historical Provider, MD  fenofibrate 160 MG tablet Take 160 mg by mouth daily.   Yes Historical Provider, MD  fexofenadine (ALLEGRA) 180 MG tablet Take 180 mg by mouth daily.   Yes Historical Provider, MD  Glucosamine-Chondroitin 1500-1200 MG/30ML LIQD Take by mouth.     Yes Historical Provider, MD  LYCOPENE PO Take by mouth.   Yes Historical Provider, MD  Multiple Vitamin (MULTIVITAMIN) capsule Take 1 capsule by mouth daily.     Yes Historical Provider, MD  Na Sulfate-K Sulfate-Mg Sulf (SUPREP BOWEL PREP KIT) 17.5-3.13-1.6 GM/180ML SOLN Take 1 kit by mouth as directed. 01/25/16  Yes Danie Binder, MD  Omega-3 Fatty Acids (FISH OIL) 1000 MG CAPS Take by mouth. Takes 2 tablets daily   Yes Historical Provider, MD  omeprazole (PRILOSEC) 20 MG capsule Take 20 mg by mouth daily. Takes 40 mg once daily   Yes Historical Provider, MD  polycarbophil (FIBERCON) 625 MG tablet Take 625 mg by mouth daily.     Yes Historical Provider, MD  polyethylene glycol-electrolytes (TRILYTE) 420 g solution Take 4,000 mLs by mouth as directed. 01/25/16  Yes Danie Binder, MD  Probiotic Product (PROBIOTIC PO) Take by mouth.     Yes Historical Provider, MD    Allergies as of 01/23/2016  . (No Known Allergies)    Family History  Problem Relation Age of Onset  . Cancer Mother   . Heart disease Father     Social History   Social History  . Marital Status: Married    Spouse Name: N/A  . Number of Children: N/A  . Years of Education: N/A   Occupational History  . Not on file.   Social History Main Topics  . Smoking status: Former Smoker    Types: Pipe    Quit date: 08/12/1984  . Smokeless tobacco: Never Used  . Alcohol Use: No  . Drug Use: No  . Sexual Activity: Not on file   Other Topics Concern  . Not on file   Social History Narrative    Review of Systems: See HPI, otherwise negative ROS   Physical Exam: BP 120/72 mmHg  Pulse 49  Temp(Src) 97.8 F (36.6 C) (Oral)  Resp 16  Ht '5\' 7"'$  (1.702 m)  Wt 164 lb (74.39 kg)  BMI 25.68 kg/m2  SpO2 100% General:   Alert,  pleasant and cooperative in NAD Head:  Normocephalic and atraumatic. Neck:  Supple; Lungs:  Clear throughout to auscultation.    Heart:  Regular rate and rhythm. Abdomen:  Soft, nontender and nondistended. Normal bowel sounds, without guarding, and without rebound.   Neurologic:  Alert and  oriented x4;  grossly normal neurologically.  Impression/Plan:      SCREENING  Plan:  1. TCS TODAY

## 2016-02-27 NOTE — Telephone Encounter (Signed)
REVIEWED-NO ADDITIONAL RECOMMENDATIONS. 

## 2016-02-28 ENCOUNTER — Encounter (HOSPITAL_COMMUNITY): Payer: Self-pay | Admitting: Gastroenterology

## 2016-03-08 ENCOUNTER — Telehealth: Payer: Self-pay | Admitting: Gastroenterology

## 2016-03-08 NOTE — Telephone Encounter (Signed)
Please call pt. 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 FOLLOW A High fiber diet. NEXT TCS IN 5-10 YEARS. 

## 2016-03-08 NOTE — Telephone Encounter (Signed)
Pt is aware of results. 

## 2016-03-08 NOTE — Telephone Encounter (Signed)
Reminder in epic °

## 2016-04-01 DIAGNOSIS — Z85828 Personal history of other malignant neoplasm of skin: Secondary | ICD-10-CM | POA: Diagnosis not present

## 2016-04-01 DIAGNOSIS — X32XXXD Exposure to sunlight, subsequent encounter: Secondary | ICD-10-CM | POA: Diagnosis not present

## 2016-04-01 DIAGNOSIS — L570 Actinic keratosis: Secondary | ICD-10-CM | POA: Diagnosis not present

## 2016-04-01 DIAGNOSIS — Z08 Encounter for follow-up examination after completed treatment for malignant neoplasm: Secondary | ICD-10-CM | POA: Diagnosis not present

## 2016-04-02 DIAGNOSIS — E782 Mixed hyperlipidemia: Secondary | ICD-10-CM | POA: Diagnosis not present

## 2016-04-04 DIAGNOSIS — R001 Bradycardia, unspecified: Secondary | ICD-10-CM | POA: Diagnosis not present

## 2016-04-04 DIAGNOSIS — K649 Unspecified hemorrhoids: Secondary | ICD-10-CM | POA: Diagnosis not present

## 2016-04-04 DIAGNOSIS — E782 Mixed hyperlipidemia: Secondary | ICD-10-CM | POA: Diagnosis not present

## 2016-04-04 DIAGNOSIS — K219 Gastro-esophageal reflux disease without esophagitis: Secondary | ICD-10-CM | POA: Diagnosis not present

## 2016-05-27 DIAGNOSIS — Z23 Encounter for immunization: Secondary | ICD-10-CM | POA: Diagnosis not present

## 2016-08-27 DIAGNOSIS — H52203 Unspecified astigmatism, bilateral: Secondary | ICD-10-CM | POA: Diagnosis not present

## 2016-08-27 DIAGNOSIS — H2513 Age-related nuclear cataract, bilateral: Secondary | ICD-10-CM | POA: Diagnosis not present

## 2016-08-27 DIAGNOSIS — H524 Presbyopia: Secondary | ICD-10-CM | POA: Diagnosis not present

## 2016-08-27 DIAGNOSIS — H5203 Hypermetropia, bilateral: Secondary | ICD-10-CM | POA: Diagnosis not present

## 2016-10-03 DIAGNOSIS — E782 Mixed hyperlipidemia: Secondary | ICD-10-CM | POA: Diagnosis not present

## 2016-10-07 DIAGNOSIS — K649 Unspecified hemorrhoids: Secondary | ICD-10-CM | POA: Diagnosis not present

## 2016-10-07 DIAGNOSIS — R0600 Dyspnea, unspecified: Secondary | ICD-10-CM | POA: Diagnosis not present

## 2016-10-07 DIAGNOSIS — E782 Mixed hyperlipidemia: Secondary | ICD-10-CM | POA: Diagnosis not present

## 2016-10-07 DIAGNOSIS — K219 Gastro-esophageal reflux disease without esophagitis: Secondary | ICD-10-CM | POA: Diagnosis not present

## 2016-10-07 DIAGNOSIS — R001 Bradycardia, unspecified: Secondary | ICD-10-CM | POA: Diagnosis not present

## 2016-10-07 DIAGNOSIS — Z86010 Personal history of colonic polyps: Secondary | ICD-10-CM | POA: Diagnosis not present

## 2016-10-07 DIAGNOSIS — Z6825 Body mass index (BMI) 25.0-25.9, adult: Secondary | ICD-10-CM | POA: Diagnosis not present

## 2016-10-28 DIAGNOSIS — R972 Elevated prostate specific antigen [PSA]: Secondary | ICD-10-CM | POA: Diagnosis not present

## 2016-11-01 ENCOUNTER — Encounter: Payer: Self-pay | Admitting: Cardiology

## 2016-11-01 NOTE — Progress Notes (Signed)
Cardiology Office Note  Date: 11/04/2016   ID: David Randall, DOB June 09, 1946, MRN [id number]  PCP: David Neighbors, MD  Consulting Cardiologist: David Lesches, MD   Chief Complaint  Patient presents with  . Dyspnea on exertion    History of Present Illness: David Randall is a 71 y.o. male referred for cardiology consultation by Dr. [place] Randall for the evaluation of shortness of breath. He states that over the last year he has noticed increased shortness of breath with activity. [place], enjoys working outdoors, also walking on trails on his property. He describes NYHA class 2-3 dyspnea on exertion, improved after a few minutes with rest. Especially noticed when he walks up hill for any distance. Reports chest tightness. Does feel a sense of palpitations when he is active. He has had no syncope. Sometimes feels dizzy when he stands up quickly. He reports undergoing a standard treadmill approximately 15 years ago.  I personally reviewed his ECG from today which shows sinus bradycardia. He has a history of bradycardia, no obvious known conduction system disease, not entirely clear that this is symptom provoking.  I reviewed his medications which are outlined below. He reports no recent additions.  Past Medical History:  Diagnosis Date  . Bradycardia   . GERD (gastroesophageal reflux disease)   . Hemorrhoids   . Hyperlipidemia   . Inguinal hernia     Past Surgical History:  Procedure Laterality Date  . COLONOSCOPY N/A 02/26/2016   Procedure: COLONOSCOPY;  Surgeon: David Binder, MD;  [facility];  Service: Endoscopy;  Laterality: N/A;  8:30 Am  . HERNIA REPAIR    . TONSILLECTOMY  1969    Current Outpatient Prescriptions  Medication Sig Dispense Refill  . Ascorbic Acid (VITAMIN C) 1000 MG tablet Take 1,000 mg by mouth daily. Reported on 01/17/2016    . Calcium Carbonate-Vitamin D (CALCIUM + D PO) Take by mouth.      Rolena Infante Sagrada 450 MG CAPS Take by mouth. Reported  on 01/17/2016    . Docusate Calcium (STOOL SOFTENER PO) Take by mouth.      . fenofibrate 160 MG tablet Take 160 mg by mouth daily.    . Glucosamine-Chondroitin 1500-1200 MG/30ML LIQD Take by mouth.      Marland Kitchen LYCOPENE PO Take by mouth.    . Multiple Vitamin (MULTIVITAMIN) capsule Take 1 capsule by mouth daily.      . Omega-3 Fatty Acids (FISH OIL) 1000 MG CAPS Take by mouth. Takes 2 tablets daily    . omeprazole (PRILOSEC) 20 MG capsule Take 20 mg by mouth daily. Takes 40 mg once daily    . polycarbophil (FIBERCON) 625 MG tablet Take 625 mg by mouth daily.      . Probiotic Product (PROBIOTIC PO) Take by mouth.       No current facility-administered medications for this visit.    Allergies:  Patient has no known allergies.   Social History: The patient  reports that he quit smoking about 32 years ago. His smoking use included Pipe. He has never used smokeless tobacco. He reports that he does not drink alcohol or use drugs.   Family History: The patient's family history includes Cancer in his mother; Heart disease in his father; Prostate cancer in his brother.   ROS:  Please see the history of present illness. Otherwise, complete review of systems is positive for upper airway congestion, no history of asthma or bronchitis.  All other systems are reviewed  and negative.   Physical Exam: VS:  BP 114/68 (BP Location: Right Arm)   Pulse (!) 56   Ht 5\' 7"  (1.702 m)   Wt 165 lb (74.8 kg)   SpO2 96%   BMI 25.84 kg/m , BMI Body mass index is 25.84 kg/m.  Wt Readings from Last 3 Encounters:  11/04/16 165 lb (74.8 kg)  02/26/16 164 lb (74.4 kg)  03/24/08 178 lb 3.2 oz (80.8 kg)    General: Patient appears comfortable at rest. HEENT: Conjunctiva and lids normal, oropharynx clear. Neck: Supple, no elevated JVP or carotid bruits, no thyromegaly. Lungs: Clear to auscultation, nonlabored breathing at rest. Cardiac: Regular rate and rhythm, no S3 or significant systolic murmur, no pericardial  rub. Abdomen: Soft, nontender, bowel sounds present, no guarding or rebound. Extremities: No pitting edema, distal pulses 2+. Skin: Warm and dry. Musculoskeletal: No kyphosis. Neuropsychiatric: Alert and oriented x3, affect grossly appropriate.  ECG: No old tracing available for comparison.  Recent Labwork:  March 2018: Cholesterol 180, triglycerides 118, HDL 28, LDL 128  Other Studies Reviewed Today:  Chest x-ray 10/24/2010: Findings: No active infiltrate or effusion is seen.  Minimal linear atelectasis or scarring is noted at the left lung base. Mediastinal contours appear normal.  The heart is within normal limits in size.  Old left rib fractures are noted involving the posterolateral fourth, fifth, sixth, and seventh ribs.  IMPRESSION: No active lung disease.  Old left rib fractures.  Assessment and Plan:  1. Dyspnea on exertion as outlined above. Reports somewhat progressive symptoms over the last year. He has not had a recent ischemic workup, states of standard GXT approximately 15 years ago. Cardiac risk factors include hyperlipidemia, age and gender. He has a remote history of tobacco use but quit back in the 1980s. No known history of chronic lung disease. ECG reviewed and nonspecific. 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 Plan will be to obtain an exercise echocardiogram to assess heart rate response, evaluate for exercise-induced arrhythmia, and exclude ischemia.  2. Hyperlipidemia, recent LDL 128 and triglycerides 118. He uses omega-3 supplements as well as fenofibrate.  3. Remote history of tobacco use, remains in remission. No known chronic lung disease.  4. History of gastroesophageal reflux, uses Prilosec. No progressive symptoms.  Current medicines were reviewed with the patient today.   Orders Placed This Encounter  Procedures  . EKG 12-Lead  . ECHOCARDIOGRAM STRESS TEST    Disposition: Call  with test results.  Signed, Satira Sark, MD, [facility] 11/04/2016 10:45 AM    [facility] [place]. [address] [place], [place] [address] Phone: [phone]; Fax: [phone]

## 2016-11-04 ENCOUNTER — Encounter: Payer: Self-pay | Admitting: Cardiology

## 2016-11-04 ENCOUNTER — Ambulatory Visit (INDEPENDENT_AMBULATORY_CARE_PROVIDER_SITE_OTHER): Payer: PPO | Admitting: Cardiology

## 2016-11-04 VITALS — BP 114/68 | HR 56 | Ht 67.0 in | Wt 165.0 lb

## 2016-11-04 DIAGNOSIS — Z87891 Personal history of nicotine dependence: Secondary | ICD-10-CM

## 2016-11-04 DIAGNOSIS — R0602 Shortness of breath: Secondary | ICD-10-CM

## 2016-11-04 DIAGNOSIS — E782 Mixed hyperlipidemia: Secondary | ICD-10-CM

## 2016-11-04 DIAGNOSIS — R0609 Other forms of dyspnea: Secondary | ICD-10-CM

## 2016-11-04 DIAGNOSIS — K219 Gastro-esophageal reflux disease without esophagitis: Secondary | ICD-10-CM

## 2016-11-04 NOTE — Patient Instructions (Signed)
Your physician recommends that you schedule a follow-up appointment in:  To be determined after tests   Your physician has requested that you have a stress echocardiogram. For further information please visit [email or web address]. Please follow instruction sheet as given.      Your physician recommends that you continue on your current medications as directed. Please refer to the Current Medication list given to you today.       Thank you for choosing [facility] !

## 2016-11-12 ENCOUNTER — Ambulatory Visit (HOSPITAL_COMMUNITY)
Admission: RE | Admit: 2016-11-12 | Discharge: 2016-11-12 | Disposition: A | Discharge status: Home / Self Care | Payer: PPO | Source: Ambulatory Visit | Attending: Cardiology | Admitting: Cardiology

## 2016-11-12 DIAGNOSIS — R0609 Other forms of dyspnea: Secondary | ICD-10-CM | POA: Diagnosis not present

## 2016-11-12 LAB — ECHOCARDIOGRAM STRESS TEST
CSEPED: 9 min
CSEPEW: 11.4 METS
CSEPPHR: 139 {beats}/min
Exercise duration (sec): 25 s
MPHR: 152 {beats}/min
Percent HR: 91 %
RPE: 15
Rest HR: 51 {beats}/min

## 2016-11-12 NOTE — Progress Notes (Addendum)
*  PRELIMINARY RESULTS* Echocardiogram  Stress Echocardiogram has been performed.  David Randall 11/12/2016, 10:32 AM

## 2017-02-26 DIAGNOSIS — X32XXXD Exposure to sunlight, subsequent encounter: Secondary | ICD-10-CM | POA: Diagnosis not present

## 2017-02-26 DIAGNOSIS — L570 Actinic keratosis: Secondary | ICD-10-CM | POA: Diagnosis not present

## 2017-02-26 DIAGNOSIS — D485 Neoplasm of uncertain behavior of skin: Secondary | ICD-10-CM | POA: Diagnosis not present

## 2017-02-26 DIAGNOSIS — Z1283 Encounter for screening for malignant neoplasm of skin: Secondary | ICD-10-CM | POA: Diagnosis not present

## 2017-02-26 DIAGNOSIS — D225 Melanocytic nevi of trunk: Secondary | ICD-10-CM | POA: Diagnosis not present

## 2017-02-26 DIAGNOSIS — D224 Melanocytic nevi of scalp and neck: Secondary | ICD-10-CM | POA: Diagnosis not present

## 2017-04-04 DIAGNOSIS — S20469A Insect bite (nonvenomous) of unspecified back wall of thorax, initial encounter: Secondary | ICD-10-CM | POA: Diagnosis not present

## 2017-04-04 DIAGNOSIS — S20361A Insect bite (nonvenomous) of right front wall of thorax, initial encounter: Secondary | ICD-10-CM | POA: Diagnosis not present

## 2017-04-04 DIAGNOSIS — Z6825 Body mass index (BMI) 25.0-25.9, adult: Secondary | ICD-10-CM | POA: Diagnosis not present

## 2017-04-29 ENCOUNTER — Ambulatory Visit (INDEPENDENT_AMBULATORY_CARE_PROVIDER_SITE_OTHER): Payer: PPO | Admitting: Family Medicine

## 2017-04-29 ENCOUNTER — Encounter: Payer: Self-pay | Admitting: Family Medicine

## 2017-04-29 VITALS — BP 132/74 | HR 58 | Temp 97.3°F | Ht 67.0 in | Wt 167.5 lb

## 2017-04-29 DIAGNOSIS — Z8042 Family history of malignant neoplasm of prostate: Secondary | ICD-10-CM | POA: Diagnosis not present

## 2017-04-29 DIAGNOSIS — E782 Mixed hyperlipidemia: Secondary | ICD-10-CM

## 2017-04-29 DIAGNOSIS — Z1159 Encounter for screening for other viral diseases: Secondary | ICD-10-CM | POA: Diagnosis not present

## 2017-04-29 DIAGNOSIS — K219 Gastro-esophageal reflux disease without esophagitis: Secondary | ICD-10-CM | POA: Diagnosis not present

## 2017-04-29 DIAGNOSIS — R002 Palpitations: Secondary | ICD-10-CM

## 2017-04-29 NOTE — Progress Notes (Signed)
BP 132/74   Pulse (!) 58   Temp (!) 97.3 F (36.3 C) (Oral)   Ht '5\' 7"'$  (1.702 m)   Wt 167 lb 8 oz (76 kg)   BMI 26.23 kg/m    Subjective:    Patient ID: David Randall, male    DOB: February 25, 1946, 71 y.o.   MRN: [id number]  HPI: David Randall is a 71 y.o. male presenting on 04/29/2017 for Establish Care (patient is not fasting)   HPI Hyperlipidemia Patient is coming in for recheck of his hyperlipidemia. The patient is currently taking fenofibrate, he has been out of his medication for at least a couple weeks. They deny any issues with myalgias or history of liver damage from it. They deny any focal numbness or weakness or chest pain.   GERD Patient is currently on omeprazole.  She denies any major symptoms or abdominal pain or belching or burping. She denies any blood in her stool or lightheadedness or dizziness.   Palpitations Patient has been having intermittent palpitations is been going on over the past couple weeks. He says he will fill them when he is at rest more than when he is active and they have woken him up at night at least once during this time. He denies any chest pain or shortness of breath associated with it.  Relevant past medical, surgical, family and social history reviewed and updated as indicated. Interim medical history since our last visit reviewed. Allergies and medications reviewed and updated.  Review of Systems  Constitutional: Negative for chills and fever.  Eyes: Negative for discharge.  Respiratory: Negative for shortness of breath and wheezing.   Cardiovascular: Positive for palpitations. Negative for chest pain and leg swelling.  Gastrointestinal: Negative for abdominal pain, constipation, diarrhea, nausea and vomiting.  Musculoskeletal: Negative for back pain and gait problem.  Skin: Negative for rash.  Neurological: Negative for dizziness, weakness, light-headedness and headaches.  All other systems reviewed and are negative.  Per HPI unless  specifically indicated above  Social History   Social History  . Marital status: Married    Spouse name: N/A  . Number of children: N/A  . Years of education: N/A   Occupational History  . Not on file.   Social History Main Topics  . Smoking status: Former Smoker    Types: Pipe    Quit date: 08/12/1984  . Smokeless tobacco: Never Used  . Alcohol use No  . Drug use: No  . Sexual activity: Yes   Other Topics Concern  . Not on file   Social History Narrative  . No narrative on file    Past Surgical History:  Procedure Laterality Date  . COLONOSCOPY N/A 02/26/2016   Procedure: COLONOSCOPY;  Surgeon: Danie Binder, MD;  Location: [facility];  Service: Endoscopy;  Laterality: N/A;  8:30 Am  . HERNIA REPAIR    . TONSILLECTOMY  1969    Family History  Problem Relation Age of Onset  . Cancer Mother        breast, bone  . Heart disease Father   . Prostate cancer Brother   . Cancer Brother        prostate  . Cancer Sister        breast         Objective:    BP 132/74   Pulse (!) 58   Temp (!) 97.3 F (36.3 C) (Oral)   Ht '5\' 7"'$  (1.702 m)   Wt 167 lb 8 oz (  76 kg)   BMI 26.23 kg/m   Wt Readings from Last 3 Encounters:  04/29/17 167 lb 8 oz (76 kg)  11/04/16 165 lb (74.8 kg)  02/26/16 164 lb (74.4 kg)    Physical Exam  Constitutional: He is oriented to person, place, and time. He appears well-developed and well-nourished. No distress.  Eyes: Conjunctivae are normal. No scleral icterus.  Neck: Neck supple. No thyromegaly present.  Cardiovascular: Normal rate, regular rhythm, normal heart sounds and intact distal pulses.   No murmur heard. Pulmonary/Chest: Effort normal and breath sounds normal. No respiratory distress. He has no wheezes. He has no rales.  Abdominal: Soft. Bowel sounds are normal. He exhibits no distension. There is no tenderness. There is no rebound.  Musculoskeletal: Normal range of motion. He exhibits no edema.  Lymphadenopathy:    He  has no cervical adenopathy.  Neurological: He is alert and oriented to person, place, and time. Coordination normal.  Skin: Skin is warm and dry. No rash noted. He is not diaphoretic.  Psychiatric: He has a normal mood and affect. His behavior is normal.  Nursing note and vitals reviewed.   Patient has EKG and echo with his cardiologist  Will do a 24-hour Holter monitor and send results to his cardiologist.    Assessment & Plan:   Problem List Items Addressed This Visit      Digestive   GERD (gastroesophageal reflux disease)   Relevant Medications   omeprazole (PRILOSEC) 40 MG capsule   magnesium citrate SOLN     Other   Hyperlipidemia - Primary   Relevant Orders   CMP14+EGFR (Completed)   Lipid panel (Completed)   Family history of prostate cancer    Other Visit Diagnoses    Need for hepatitis C screening test       Relevant Orders   Hepatitis C antibody (Completed)   Palpitations       Relevant Orders   Holter monitor - 24 hour     Follow up plan: Return in about 6 months (around 10/27/2017), or if symptoms worsen or fail to improve, for adult well exam, labs.  Caryl Pina, MD [facility] 04/29/2017, 10:05 AM

## 2017-04-30 DIAGNOSIS — R002 Palpitations: Secondary | ICD-10-CM | POA: Diagnosis not present

## 2017-04-30 LAB — CMP14+EGFR
ALBUMIN: 4.5 g/dL (ref 3.6–4.8)
ALT: 16 IU/L (ref 0–44)
AST: 22 IU/L (ref 0–40)
Albumin/Globulin Ratio: 1.7 (ref 1.2–2.2)
Alkaline Phosphatase: 43 IU/L (ref 39–117)
BUN/Creatinine Ratio: 14 (ref 10–24)
BUN: 13 mg/dL (ref 8–27)
Bilirubin Total: 0.4 mg/dL (ref 0.0–1.2)
CO2: 25 mmol/L (ref 20–29)
CREATININE: 0.96 mg/dL (ref 0.76–1.27)
Calcium: 9.9 mg/dL (ref 8.6–10.2)
Chloride: 103 mmol/L (ref 96–106)
GFR calc Af Amer: 93 mL/min/{1.73_m2} (ref >59)
GFR, EST NON AFRICAN AMERICAN: 80 mL/min/{1.73_m2} (ref >59)
GLOBULIN, TOTAL: 2.7 g/dL (ref 1.5–4.5)
Glucose: 62 mg/dL — ABNORMAL LOW (ref 65–99)
Potassium: 4.4 mmol/L (ref 3.5–5.2)
SODIUM: 140 mmol/L (ref 134–144)
Total Protein: 7.2 g/dL (ref 6.0–8.5)

## 2017-04-30 LAB — LIPID PANEL
Chol/HDL Ratio: 7.9 ratio — ABNORMAL HIGH (ref 0.0–5.0)
Cholesterol, Total: 198 mg/dL (ref 100–199)
HDL: 25 mg/dL — ABNORMAL LOW (ref >39)
LDL CALC: 133 mg/dL — AB (ref 0–99)
TRIGLYCERIDES: 200 mg/dL — AB (ref 0–149)
VLDL CHOLESTEROL CAL: 40 mg/dL (ref 5–40)

## 2017-04-30 LAB — HEPATITIS C ANTIBODY: Hep C Virus Ab: <0.1 s/co ratio (ref 0.0–0.9)

## 2017-05-02 ENCOUNTER — Telehealth: Payer: Self-pay

## 2017-05-02 NOTE — Telephone Encounter (Signed)
Per Dr. 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  This patient has a cardiologist.  Please find out who that is and send them these results.  Left message for patient to call back.

## 2017-05-05 NOTE — Telephone Encounter (Signed)
Patient informed of Holter Monitor results.  He will follow up with his cardiologist, Dr. Domenic Polite.  Results of monitor were faxed to Dr. Myles Gip office.

## 2017-06-10 ENCOUNTER — Telehealth: Payer: Self-pay | Admitting: Cardiology

## 2017-06-10 NOTE — Telephone Encounter (Signed)
Holter result is in [facility] on 05/12/17 by someone with initials JCS  Attempted to reach pt, [id number]

## 2017-06-10 NOTE — Telephone Encounter (Signed)
Would like to know if the Holter results from Dr. Warrick Parisian were reviewed by Dr. Domenic Polite --please give pt a call

## 2017-06-11 NOTE — Telephone Encounter (Signed)
Pt notified that Holter monitor has been read and resulted to Dr. Warrick Parisian

## 2017-07-02 ENCOUNTER — Other Ambulatory Visit: Payer: Self-pay | Admitting: Family Medicine

## 2017-07-02 MED ORDER — HYDROCORTISONE 2.5 % EX CREA: 1.0000 "application " | TOPICAL_CREAM | Freq: Three times a day (TID) | CUTANEOUS | 5 refills | Status: DC

## 2017-07-02 NOTE — Telephone Encounter (Signed)
Go ahead and call in a refill for it, have them give him the largest tube, I do not know what sized to come in.  Give him 5 refills.  One application topically daily.

## 2017-07-02 NOTE — Telephone Encounter (Signed)
Pt notified that prescription was sent. 

## 2017-08-20 ENCOUNTER — Ambulatory Visit (INDEPENDENT_AMBULATORY_CARE_PROVIDER_SITE_OTHER): Payer: PPO | Admitting: *Deleted

## 2017-08-20 VITALS — BP 110/65 | HR 53 | Ht 67.0 in | Wt 171.0 lb

## 2017-08-20 DIAGNOSIS — Z0000 Encounter for general adult medical examination without abnormal findings: Secondary | ICD-10-CM

## 2017-08-20 NOTE — Progress Notes (Signed)
Subjective:   David Randall is a 72 y.o. male who presents for an Initial Medicare Annual Wellness Visit.  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  He lives at home with his wife, and 2 cats.  Mr. Rossetti has one son who lives in [place], [place].  He has had no hospitalizations, emergency room visits, or surgeries in the past year.  He feels his health is about the same this year as it was last year.     Review of Systems  All systems negative     Objective:    Today's Vitals   08/20/17 0816  BP: 110/65  Pulse: (!) 53  Weight: 171 lb (77.6 kg)  Height: 5\' 7"  (1.702 m)  PainSc: 0-No pain   Body mass index is 26.78 kg/m.  Advanced Directives 08/20/2017 02/26/2016  Does Patient Have a Medical Advance Directive? Yes Yes  Type of Paramedic of [place];Living will Living will  Does patient want to make changes to medical advance directive? No - Patient declined -  Copy of [place] in Chart? No - copy requested No - copy requested    Current Medications (verified) Outpatient Encounter Medications as of 08/20/2017  Medication Sig  . ascorbic acid (VITAMIN C) 500 MG tablet Take 500 mg by mouth daily.  Marland Kitchen b complex vitamins capsule Take 1 capsule by mouth daily.  . clobetasol cream (TEMOVATE) 9.21 % Apply 1 application topically 2 (two) times daily as needed.  . fenofibrate 160 MG tablet Take 160 mg by mouth daily.  . fluticasone (FLONASE) 50 MCG/ACT nasal spray Place 2 sprays into both nostrils daily.  . Glucosamine-Chondroitin 1500-1200 MG/30ML LIQD Take by mouth.    . hydrocortisone 2.5 % cream Apply 1 application topically 3 (three) times daily. Proctozone  . loratadine (CLARITIN) 10 MG tablet Take 10 mg by mouth daily.  Marland Kitchen LYCOPENE PO Take by mouth.  . magnesium citrate SOLN Take 1 Bottle by mouth once.  . Multiple Vitamin (MULTIVITAMIN) capsule Take 1 capsule by mouth daily.    . Omega-3  Fatty Acids (FISH OIL) 1000 MG CAPS Take by mouth. Takes 2 tablets daily  . omeprazole (PRILOSEC) 40 MG capsule Take 40 mg by mouth daily.  . polycarbophil (FIBERCON) 625 MG tablet Take 625 mg by mouth daily.    . Probiotic Product (PROBIOTIC PO) Take by mouth.    . vitamin B-12 (CYANOCOBALAMIN) 1000 MCG tablet Take 1,000 mcg by mouth daily.   No facility-administered encounter medications on file as of 08/20/2017.     Allergies (verified) Statins and Niacin and related   History: Past Medical History:  Diagnosis Date  . Allergy   . Bradycardia   . GERD (gastroesophageal reflux disease)   . Hemorrhoids   . Hyperlipidemia   . Inguinal hernia    Past Surgical History:  Procedure Laterality Date  . COLONOSCOPY N/A 02/26/2016   Procedure: COLONOSCOPY;  Surgeon: Danie Binder, MD;  Location: [facility];  Service: Endoscopy;  Laterality: N/A;  8:30 Am  . HERNIA REPAIR    . TONSILLECTOMY  1969   Family History  Problem Relation Age of Onset  . Cancer Mother        breast, bone  . Heart disease Father   . Prostate cancer Brother   . Cancer Brother        prostate  . Cancer Sister  breast   Social History   Socioeconomic History  . Marital status: Married    Spouse name: None  . Number of children: None  . Years of education: None  . Highest education level: None  Social Needs  . Financial resource strain: None  . Food insecurity - worry: None  . Food insecurity - inability: None  . Transportation needs - medical: None  . Transportation needs - non-medical: None  Occupational History  . None  Tobacco Use  . Smoking status: Former Smoker    Types: Pipe    Last attempt to quit: 08/12/1984    Years since quitting: 33.0  . Smokeless tobacco: Never Used  Substance and Sexual Activity  . Alcohol use: No  . Drug use: No  . Sexual activity: Yes  Other Topics Concern  . None  Social History Narrative  . None   Tobacco Counseling Counseling given:  No   Clinical Intake:     Pain Score: 0-No pain                 Activities of Daily Living In your present state of health, do you have any difficulty performing the following activities: 08/20/2017  Hearing? N  Vision? N  Difficulty concentrating or making decisions? N  Comment Trouble remembering things ocassionally  Walking or climbing stairs? N  Dressing or bathing? N  Doing errands, shopping? N  Preparing Food and eating ? N  Using the Toilet? N  In the past six months, have you accidently leaked urine? N  Do you have problems with loss of bowel control? N  Managing your Medications? N  Managing your Finances? N  Housekeeping or managing your Housekeeping? N  Some recent data might be hidden     Immunizations and Health Maintenance Immunization History  Administered Date(s) Administered  . Influenza,inj,quad, With Preservative 05/29/2017   Health Maintenance Due  Topic Date Due  . TETANUS/TDAP  03/24/1967  . PNA vac Low Risk Adult (1 of 2 - PCV13) 03/23/2013   Patient states he has had prevnar, pneumovax, and tdap at Dr. Olivia Canter office.  Will request these records to update patient's chart.   Patient Care Team: Dettinger, Fransisca Kaufmann, MD as PCP - General (Family Medicine) Rutherford Guys, MD as Consulting Physician (Ophthalmology) Satira Sark, MD as Consulting Physician (Cardiology) Danie Binder, MD as Consulting Physician (Gastroenterology)    Indicate any recent Medical Services you may have received from other than [facility] providers in the past year (date may be approximate).    Assessment:   This is a routine wellness examination for Daegan.  Hearing/Vision screen No hearing deficit noted. Patient sees Dr. Rutherford Guys for his vision checks, and is up to date on this.  He states he sees well with his glasses.  Dietary issues and exercise activities discussed:     Patient states he does 10-15 minutes of stretching each morning, and  walks 45 minutes 1-3 times per week.  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  States he eats a healthy diet of mostly lean protein ( minimal red meat), vegetables, fruits and whole grains.  States he would like to lose about 5 pounds and plans to do this by reducing his sugar intake.   Goals    . DIET - REDUCE SUGAR INTAKE     Cut back on processed snacks and desserts  Depression Screen PHQ 2/9 Scores 08/20/2017 04/29/2017  PHQ - 2 Score 0 0    Fall Risk Fall Risk  08/20/2017  Falls in the past year? No       Cognitive Function: MMSE - Mini Mental State Exam 08/20/2017  Orientation to time 5  Orientation to Place 5  Registration 3  Attention/ Calculation 5  Recall 3  Language- name 2 objects 2  Language- repeat 1  Language- follow 3 step command 3  Language- read & follow direction 1  Write a sentence 1  Copy design 1  Total score 30        Screening Tests Health Maintenance  Topic Date Due  . TETANUS/TDAP  03/24/1967  . PNA vac Low Risk Adult (1 of 2 - PCV13) 03/23/2013  . COLONOSCOPY  02/25/2026  . INFLUENZA VACCINE  Completed  . Hepatitis C Screening  Completed    Qualifies for Shingles Vaccine? Yes  Cancer Screenings: Lung: Low Dose CT Chest recommended if Age 63-80 years, 30 pack-year currently smoking OR have quit w/in 15years. Patient does qualify. Colorectal: up to date         Plan:     Work on decreasing sugar intake to help meet your goal of losing 5 lbs. We will get your immunization records from Dr. Juel Burrow office and update your immunizations in your medical record. Bring a copy of your Living will and [place] to your next visit with Dr. Warrick Parisian to be filled in your medical record.  I have personally reviewed and noted the following in the patient's chart:   . Medical and social history . Use of alcohol, tobacco or illicit drugs  . Current  medications and supplements . Functional ability and status . Nutritional status . Physical activity . Advanced directives . List of other physicians . Hospitalizations, surgeries, and ER visits in previous 12 months . Vitals . Screenings to include cognitive, depression, and falls . Referrals and appointments  In addition, I have reviewed and discussed with patient certain preventive protocols, quality metrics, and best practice recommendations. A written personalized care plan for preventive services as well as general preventive health recommendations were provided to patient.     Denyce Robert, RN   08/20/2017

## 2017-08-20 NOTE — Patient Instructions (Signed)
Keep up the great work on your exercise and diet regimen!  For your goal- work on decreasing sugar intake.  We will get your immunization records from Dr. Juel Burrow office and update your immunizations in your medical record.  Please bring a copy of your Living will and [place] to your next visit with Dr. Warrick Parisian to be filled in your medical record.  Thank you for coming in for your Annual Wellness Visit today!!   Preventive Care 65 Years and Older, Male Preventive care refers to lifestyle choices and visits with your health care provider that can promote health and wellness. What does preventive care include?  A yearly physical exam. This is also called an annual well check.  Dental exams once or twice a year.  Routine eye exams. Ask your health care provider how often you should have your eyes checked.  Personal lifestyle choices, including: ? Daily care of your teeth and gums. ? Regular physical activity. ? Eating a healthy diet. ? Avoiding tobacco and drug use. ? Limiting alcohol use. ? Practicing safe sex. ? Taking low doses of aspirin every day. ? Taking vitamin and mineral supplements as recommended by your health care provider. What happens during an annual well check? The services and screenings done by your health care provider during your annual well check will depend on your age, overall health, lifestyle risk factors, and family history of disease. Counseling Your health care provider may ask you questions about your:  Alcohol use.  Tobacco use.  Drug use.  Emotional well-being.  Home and relationship well-being.  Sexual activity.  Eating habits.  History of falls.  Memory and ability to understand (cognition).  Work and work Statistician.  Screening You may have the following tests or measurements:  Height, weight, and BMI.  Blood pressure.  Lipid and cholesterol levels. These may be checked every 5 years, or more  frequently if you are over 6 years old.  Skin check.  Lung cancer screening. You may have this screening every year starting at age 21 if you have a 30-pack-year history of smoking and currently smoke or have quit within the past 15 years.  Fecal occult blood test (FOBT) of the stool. You may have this test every year starting at age 32.  Flexible sigmoidoscopy or colonoscopy. You may have a sigmoidoscopy every 5 years or a colonoscopy every 10 years starting at age 51.  Prostate cancer screening. Recommendations will vary depending on your family history and other risks.  Hepatitis C blood test.  Hepatitis B blood test.  Sexually transmitted disease (STD) testing.  Diabetes screening. This is done by checking your blood sugar (glucose) after you have not eaten for a while (fasting). You may have this done every 1-3 years.  Abdominal aortic aneurysm (AAA) screening. You may need this if you are a current or former smoker.  Osteoporosis. You may be screened starting at age 46 if you are at high risk.  Talk with your health care provider about your test results, treatment options, and if necessary, the need for more tests. Vaccines Your health care provider may recommend certain vaccines, such as:  Influenza vaccine. This is recommended every year.  Tetanus, diphtheria, and acellular pertussis (Tdap, Td) vaccine. You may need a Td booster every 10 years.  Varicella vaccine. You may need this if you have not been vaccinated.  Zoster vaccine. You may need this after age 42.  Measles, mumps, and rubella (MMR) vaccine. You may need  at least one dose of MMR if you were born in 1957 or later. You may also need a second dose.  Pneumococcal 13-valent conjugate (PCV13) vaccine. One dose is recommended after age 76.  Pneumococcal polysaccharide (PPSV23) vaccine. One dose is recommended after age 12.  Meningococcal vaccine. You may need this if you have certain conditions.  Hepatitis  A vaccine. You may need this if you have certain conditions or if you travel or work in places where you may be exposed to hepatitis A.  Hepatitis B vaccine. You may need this if you have certain conditions or if you travel or work in places where you may be exposed to hepatitis B.  Haemophilus influenzae type b (Hib) vaccine. You may need this if you have certain risk factors.  Talk to your health care provider about which screenings and vaccines you need and how often you need them. This information is not intended to replace advice given to you by your health care provider. Make sure you discuss any questions you have with your health care provider. Document Released: 08/25/2015 Document Revised: 04/17/2016 Document Reviewed: 05/30/2015 Elsevier Interactive Patient Education  [employer].

## 2017-09-02 DIAGNOSIS — H2513 Age-related nuclear cataract, bilateral: Secondary | ICD-10-CM | POA: Diagnosis not present

## 2017-09-02 DIAGNOSIS — H52203 Unspecified astigmatism, bilateral: Secondary | ICD-10-CM | POA: Diagnosis not present

## 2017-09-02 DIAGNOSIS — H5203 Hypermetropia, bilateral: Secondary | ICD-10-CM | POA: Diagnosis not present

## 2017-09-02 DIAGNOSIS — H524 Presbyopia: Secondary | ICD-10-CM | POA: Diagnosis not present

## 2017-09-23 ENCOUNTER — Other Ambulatory Visit: Payer: Self-pay | Admitting: *Deleted

## 2017-09-23 MED ORDER — FENOFIBRATE 160 MG PO TABS: 160.0000 mg | ORAL_TABLET | Freq: Every day | ORAL | 0 refills | Status: DC

## 2017-09-23 MED ORDER — OMEPRAZOLE 40 MG PO CPDR: 40.0000 mg | DELAYED_RELEASE_CAPSULE | Freq: Every day | ORAL | 0 refills | Status: DC

## 2017-09-23 MED ORDER — FLUTICASONE PROPIONATE 50 MCG/ACT NA SUSP: 2.0000 | Freq: Every day | NASAL | 0 refills | Status: DC

## 2017-10-21 ENCOUNTER — Other Ambulatory Visit: Payer: Self-pay

## 2017-10-21 DIAGNOSIS — E782 Mixed hyperlipidemia: Secondary | ICD-10-CM

## 2017-10-21 DIAGNOSIS — Z8042 Family history of malignant neoplasm of prostate: Secondary | ICD-10-CM

## 2017-10-23 ENCOUNTER — Other Ambulatory Visit: Payer: PPO

## 2017-10-23 DIAGNOSIS — Z8042 Family history of malignant neoplasm of prostate: Secondary | ICD-10-CM | POA: Diagnosis not present

## 2017-10-23 DIAGNOSIS — E782 Mixed hyperlipidemia: Secondary | ICD-10-CM

## 2017-10-24 LAB — CMP14+EGFR
A/G RATIO: 1.5 (ref 1.2–2.2)
ALK PHOS: 41 IU/L (ref 39–117)
ALT: 20 IU/L (ref 0–44)
AST: 18 IU/L (ref 0–40)
Albumin: 4.1 g/dL (ref 3.6–4.8)
BUN/Creatinine Ratio: 18 (ref 10–24)
BUN: 18 mg/dL (ref 8–27)
Bilirubin Total: 0.3 mg/dL (ref 0.0–1.2)
CO2: 25 mmol/L (ref 20–29)
CREATININE: 1.01 mg/dL (ref 0.76–1.27)
Calcium: 9.4 mg/dL (ref 8.6–10.2)
Chloride: 101 mmol/L (ref 96–106)
GFR calc Af Amer: 87 mL/min/{1.73_m2} (ref >59)
GFR calc non Af Amer: 76 mL/min/{1.73_m2} (ref >59)
Globulin, Total: 2.7 g/dL (ref 1.5–4.5)
Glucose: 73 mg/dL (ref 65–99)
POTASSIUM: 4.2 mmol/L (ref 3.5–5.2)
SODIUM: 141 mmol/L (ref 134–144)
Total Protein: 6.8 g/dL (ref 6.0–8.5)

## 2017-10-24 LAB — CBC WITH DIFFERENTIAL/PLATELET
Basophils Absolute: 0.1 10*3/uL (ref 0.0–0.2)
Basos: 1 %
EOS (ABSOLUTE): 0.5 10*3/uL — AB (ref 0.0–0.4)
Eos: 7 %
HEMATOCRIT: 44.8 % (ref 37.5–51.0)
Hemoglobin: 14.7 g/dL (ref 13.0–17.7)
IMMATURE GRANULOCYTES: 0 %
Immature Grans (Abs): 0 10*3/uL (ref 0.0–0.1)
LYMPHS ABS: 2.1 10*3/uL (ref 0.7–3.1)
Lymphs: 34 %
MCH: 28.4 pg (ref 26.6–33.0)
MCHC: 32.8 g/dL (ref 31.5–35.7)
MCV: 87 fL (ref 79–97)
MONOS ABS: 0.8 10*3/uL (ref 0.1–0.9)
Monocytes: 13 %
NEUTROS PCT: 45 %
Neutrophils Absolute: 2.9 10*3/uL (ref 1.4–7.0)
PLATELETS: 297 10*3/uL (ref 150–379)
RBC: 5.17 x10E6/uL (ref 4.14–5.80)
RDW: 14.1 % (ref 12.3–15.4)
WBC: 6.4 10*3/uL (ref 3.4–10.8)

## 2017-10-24 LAB — LIPID PANEL
CHOLESTEROL TOTAL: 186 mg/dL (ref 100–199)
Chol/HDL Ratio: 6.6 ratio — ABNORMAL HIGH (ref 0.0–5.0)
HDL: 28 mg/dL — AB (ref >39)
LDL Calculated: 135 mg/dL — ABNORMAL HIGH (ref 0–99)
Triglycerides: 114 mg/dL (ref 0–149)
VLDL Cholesterol Cal: 23 mg/dL (ref 5–40)

## 2017-10-24 LAB — PSA, TOTAL AND FREE
PROSTATE SPECIFIC AG, SERUM: 1.7 ng/mL (ref 0.0–4.0)
PSA FREE PCT: 25.9 %
PSA FREE: 0.44 ng/mL

## 2017-10-27 ENCOUNTER — Ambulatory Visit: Payer: PPO | Admitting: Family Medicine

## 2017-10-27 ENCOUNTER — Encounter: Payer: Self-pay | Admitting: Family Medicine

## 2017-10-27 ENCOUNTER — Ambulatory Visit (INDEPENDENT_AMBULATORY_CARE_PROVIDER_SITE_OTHER): Payer: PPO | Admitting: Family Medicine

## 2017-10-27 VITALS — BP 134/78 | HR 59 | Temp 97.9°F | Ht 67.0 in | Wt 165.0 lb

## 2017-10-27 DIAGNOSIS — Z0000 Encounter for general adult medical examination without abnormal findings: Secondary | ICD-10-CM | POA: Diagnosis not present

## 2017-10-27 DIAGNOSIS — E782 Mixed hyperlipidemia: Secondary | ICD-10-CM

## 2017-10-30 NOTE — Progress Notes (Signed)
BP 134/78   Pulse (!) 59   Temp 97.9 F (36.6 C) (Oral)   Ht 5\' 7"  (1.702 m)   Wt 165 lb (74.8 kg)   BMI 25.84 kg/m    Subjective:    Patient ID: David Randall, male    DOB: 12/14/1945, 72 y.o.   MRN: [id number]  HPI: David Randall is a 72 y.o. male presenting on 10/27/2017 for Annual Exam   HPI Adult well exam and recheck cholesterol Patient is coming in today for adult well exam and recheck cholesterol.  Denies any major health issues or concerns. Patient denies any chest pain, shortness of breath, headaches or vision issues, abdominal complaints, diarrhea, nausea, vomiting, or joint issues.   Relevant past medical, surgical, family and social history reviewed and updated as indicated. Interim medical history since our last visit reviewed. Allergies and medications reviewed and updated.  Review of Systems  Constitutional: Negative for chills and fever.  HENT: Negative for ear pain and tinnitus.   Eyes: Negative for pain and discharge.  Respiratory: Negative for cough, shortness of breath and wheezing.   Cardiovascular: Negative for chest pain, palpitations and leg swelling.  Gastrointestinal: Negative for abdominal pain, blood in stool, constipation and diarrhea.  Genitourinary: Negative for dysuria and hematuria.  Musculoskeletal: Negative for back pain, gait problem and myalgias.  Skin: Negative for rash.  Neurological: Negative for dizziness, weakness and headaches.  Psychiatric/Behavioral: Negative for suicidal ideas.  All other systems reviewed and are negative.   Per HPI unless specifically indicated above   Allergies as of 10/27/2017      Reactions   Statins Other (See Comments)   Myalgias, fatigue, depression   Niacin And Related Rash   Rash and itching      Medication List        Accurate as of 10/27/17 11:59 PM. Always use your most recent med list.          b complex vitamins capsule Take 1 capsule by mouth daily.   clobetasol cream 0.05  % Commonly known as:  TEMOVATE Apply 1 application topically 2 (two) times daily as needed.   fenofibrate 160 MG tablet Take 1 tablet (160 mg total) by mouth daily.   Fish Oil 1000 MG Caps Take by mouth. Takes 2 tablets daily   fluticasone 50 MCG/ACT nasal spray Commonly known as:  FLONASE Place 2 sprays into both nostrils daily.   Glucosamine-Chondroitin 1500-1200 MG/30ML Liqd Take by mouth.   hydrocortisone 2.5 % cream Apply 1 application topically 3 (three) times daily. Proctozone   loratadine 10 MG tablet Commonly known as:  CLARITIN Take 10 mg by mouth daily.   LYCOPENE PO Take by mouth.   magnesium citrate Soln Take 1 Bottle by mouth once.   multivitamin capsule Take 1 capsule by mouth daily.   omeprazole 40 MG capsule Commonly known as:  PRILOSEC Take 1 capsule (40 mg total) by mouth daily.   polycarbophil 625 MG tablet Commonly known as:  FIBERCON Take 625 mg by mouth daily.   PROBIOTIC PO Take by mouth.          Objective:    BP 134/78   Pulse (!) 59   Temp 97.9 F (36.6 C) (Oral)   Ht 5\' 7"  (1.702 m)   Wt 165 lb (74.8 kg)   BMI 25.84 kg/m   Wt Readings from Last 3 Encounters:  10/27/17 165 lb (74.8 kg)  08/20/17 171 lb (77.6 kg)  04/29/17 167 lb 8 oz (76  kg)    Physical Exam  Constitutional: He is oriented to person, place, and time. He appears well-developed and well-nourished. No distress.  HENT:  Right Ear: External ear normal.  Left Ear: External ear normal.  Nose: Nose normal.  Mouth/Throat: Oropharynx is clear and moist. No oropharyngeal exudate.  Eyes: Pupils are equal, round, and reactive to light. Conjunctivae and EOM are normal. Right eye exhibits no discharge. No scleral icterus.  Neck: Neck supple. No thyromegaly present.  Cardiovascular: Normal rate, regular rhythm, normal heart sounds and intact distal pulses.  No murmur heard. Pulmonary/Chest: Effort normal and breath sounds normal. No respiratory distress. He has no  wheezes.  Abdominal: Soft. Bowel sounds are normal. He exhibits no distension. There is no tenderness. There is no rebound and no guarding.  Musculoskeletal: Normal range of motion. He exhibits no edema.  Lymphadenopathy:    He has no cervical adenopathy.  Neurological: He is alert and oriented to person, place, and time. Coordination normal.  Skin: Skin is warm and dry. No rash noted. He is not diaphoretic.  Psychiatric: He has a normal mood and affect. His behavior is normal.  Vitals reviewed.       Assessment & Plan:   Problem List Items Addressed This Visit      Other   Hyperlipidemia    Other Visit Diagnoses    Well adult exam    -  Primary       Follow up plan: Return in about 1 year (around 10/28/2018), or if symptoms worsen or fail to improve.  Counseling provided for all of the vaccine components No orders of the defined types were placed in this encounter.   Caryl Pina, MD [facility] 10/27/2017, 4:13 PM

## 2017-12-15 ENCOUNTER — Other Ambulatory Visit: Payer: Self-pay | Admitting: Family Medicine

## 2018-01-31 ENCOUNTER — Other Ambulatory Visit: Payer: Self-pay | Admitting: Family Medicine

## 2018-03-05 DIAGNOSIS — L570 Actinic keratosis: Secondary | ICD-10-CM | POA: Diagnosis not present

## 2018-03-05 DIAGNOSIS — X32XXXD Exposure to sunlight, subsequent encounter: Secondary | ICD-10-CM | POA: Diagnosis not present

## 2018-03-05 DIAGNOSIS — L84 Corns and callosities: Secondary | ICD-10-CM | POA: Diagnosis not present

## 2018-03-05 DIAGNOSIS — D225 Melanocytic nevi of trunk: Secondary | ICD-10-CM | POA: Diagnosis not present

## 2018-03-05 DIAGNOSIS — M7130 Other bursal cyst, unspecified site: Secondary | ICD-10-CM | POA: Diagnosis not present

## 2018-03-05 DIAGNOSIS — Z1283 Encounter for screening for malignant neoplasm of skin: Secondary | ICD-10-CM | POA: Diagnosis not present

## 2018-03-09 ENCOUNTER — Other Ambulatory Visit: Payer: Self-pay | Admitting: Family Medicine

## 2018-03-27 ENCOUNTER — Other Ambulatory Visit: Payer: Self-pay | Admitting: Family Medicine

## 2018-04-02 ENCOUNTER — Other Ambulatory Visit: Payer: Self-pay | Admitting: Family Medicine

## 2018-06-04 ENCOUNTER — Other Ambulatory Visit: Payer: Self-pay | Admitting: Family Medicine

## 2018-06-05 NOTE — Telephone Encounter (Signed)
OV 10/28/17 rtc 1 yr

## 2018-06-10 ENCOUNTER — Other Ambulatory Visit: Payer: Self-pay | Admitting: Family Medicine

## 2018-06-10 NOTE — Telephone Encounter (Signed)
OV 10/27/17 rtc 1 yr

## 2018-09-11 ENCOUNTER — Encounter: Payer: Self-pay | Admitting: *Deleted

## 2018-10-01 ENCOUNTER — Other Ambulatory Visit: Payer: Self-pay | Admitting: Family Medicine

## 2018-10-01 MED ORDER — HYDROCORTISONE 2.5 % EX CREA: 1.0000 "application " | TOPICAL_CREAM | Freq: Three times a day (TID) | CUTANEOUS | 5 refills | Status: DC

## 2018-10-23 ENCOUNTER — Other Ambulatory Visit: Payer: PPO

## 2018-10-23 ENCOUNTER — Other Ambulatory Visit: Payer: Self-pay

## 2018-10-23 DIAGNOSIS — Z8042 Family history of malignant neoplasm of prostate: Secondary | ICD-10-CM

## 2018-10-23 DIAGNOSIS — E782 Mixed hyperlipidemia: Secondary | ICD-10-CM | POA: Diagnosis not present

## 2018-10-24 LAB — LIPID PANEL
CHOL/HDL RATIO: 6.8 ratio — AB (ref 0.0–5.0)
CHOLESTEROL TOTAL: 204 mg/dL — AB (ref 100–199)
HDL: 30 mg/dL — ABNORMAL LOW (ref >39)
LDL CALC: 151 mg/dL — AB (ref 0–99)
TRIGLYCERIDES: 115 mg/dL (ref 0–149)
VLDL CHOLESTEROL CAL: 23 mg/dL (ref 5–40)

## 2018-10-24 LAB — CBC WITH DIFFERENTIAL/PLATELET
BASOS ABS: 0.1 10*3/uL (ref 0.0–0.2)
Basos: 2 %
EOS (ABSOLUTE): 0.5 10*3/uL — ABNORMAL HIGH (ref 0.0–0.4)
EOS: 9 %
HEMATOCRIT: 44.1 % (ref 37.5–51.0)
HEMOGLOBIN: 14.9 g/dL (ref 13.0–17.7)
IMMATURE GRANS (ABS): 0 10*3/uL (ref 0.0–0.1)
Immature Granulocytes: 0 %
LYMPHS ABS: 2 10*3/uL (ref 0.7–3.1)
LYMPHS: 34 %
MCH: 28.6 pg (ref 26.6–33.0)
MCHC: 33.8 g/dL (ref 31.5–35.7)
MCV: 85 fL (ref 79–97)
MONOCYTES: 11 %
Monocytes Absolute: 0.7 10*3/uL (ref 0.1–0.9)
NEUTROS ABS: 2.6 10*3/uL (ref 1.4–7.0)
Neutrophils: 44 %
Platelets: 292 10*3/uL (ref 150–450)
RBC: 5.21 x10E6/uL (ref 4.14–5.80)
RDW: 13.1 % (ref 11.6–15.4)
WBC: 6 10*3/uL (ref 3.4–10.8)

## 2018-10-24 LAB — CMP14+EGFR
ALBUMIN: 4.6 g/dL (ref 3.8–4.8)
ALT: 23 IU/L (ref 0–44)
AST: 26 IU/L (ref 0–40)
Albumin/Globulin Ratio: 1.8 (ref 1.2–2.2)
Alkaline Phosphatase: 44 IU/L (ref 39–117)
BILIRUBIN TOTAL: 0.3 mg/dL (ref 0.0–1.2)
BUN / CREAT RATIO: 19 (ref 10–24)
BUN: 20 mg/dL (ref 8–27)
CHLORIDE: 102 mmol/L (ref 96–106)
CO2: 21 mmol/L (ref 20–29)
CREATININE: 1.04 mg/dL (ref 0.76–1.27)
Calcium: 10 mg/dL (ref 8.6–10.2)
GFR calc non Af Amer: 72 mL/min/{1.73_m2} (ref >59)
GFR, EST AFRICAN AMERICAN: 84 mL/min/{1.73_m2} (ref >59)
GLUCOSE: 84 mg/dL (ref 65–99)
Globulin, Total: 2.6 g/dL (ref 1.5–4.5)
Potassium: 4.3 mmol/L (ref 3.5–5.2)
Sodium: 140 mmol/L (ref 134–144)
TOTAL PROTEIN: 7.2 g/dL (ref 6.0–8.5)

## 2018-10-24 LAB — PSA, TOTAL AND FREE
PROSTATE SPECIFIC AG, SERUM: 1.6 ng/mL (ref 0.0–4.0)
PSA, Free Pct: 25.6 %
PSA, Free: 0.41 ng/mL

## 2018-10-28 ENCOUNTER — Encounter: Payer: PPO | Admitting: Family Medicine

## 2018-10-29 ENCOUNTER — Encounter: Payer: PPO | Admitting: Family Medicine

## 2018-11-27 ENCOUNTER — Other Ambulatory Visit: Payer: Self-pay | Admitting: Family Medicine

## 2018-12-03 ENCOUNTER — Other Ambulatory Visit: Payer: Self-pay | Admitting: Family Medicine

## 2018-12-04 NOTE — Telephone Encounter (Signed)
Last seen 10/27/17

## 2019-02-01 ENCOUNTER — Encounter: Payer: PPO | Admitting: Family Medicine

## 2019-02-08 ENCOUNTER — Other Ambulatory Visit: Payer: Self-pay | Admitting: Family Medicine

## 2019-02-17 ENCOUNTER — Other Ambulatory Visit: Payer: Self-pay

## 2019-02-18 ENCOUNTER — Ambulatory Visit (INDEPENDENT_AMBULATORY_CARE_PROVIDER_SITE_OTHER): Payer: PPO | Admitting: Family Medicine

## 2019-02-18 ENCOUNTER — Encounter: Payer: Self-pay | Admitting: Family Medicine

## 2019-02-18 VITALS — BP 105/62 | HR 60 | Temp 97.0°F | Ht 67.0 in | Wt 166.4 lb

## 2019-02-18 DIAGNOSIS — Z125 Encounter for screening for malignant neoplasm of prostate: Secondary | ICD-10-CM | POA: Diagnosis not present

## 2019-02-18 DIAGNOSIS — Z0001 Encounter for general adult medical examination with abnormal findings: Secondary | ICD-10-CM

## 2019-02-18 DIAGNOSIS — E782 Mixed hyperlipidemia: Secondary | ICD-10-CM | POA: Diagnosis not present

## 2019-02-18 DIAGNOSIS — K219 Gastro-esophageal reflux disease without esophagitis: Secondary | ICD-10-CM | POA: Diagnosis not present

## 2019-02-18 DIAGNOSIS — Z0000 Encounter for general adult medical examination without abnormal findings: Secondary | ICD-10-CM

## 2019-02-18 NOTE — Progress Notes (Signed)
BP 105/62   Pulse 60   Temp (!) 97 F (36.1 C) (Oral)   Ht _0  (1.702 m)   Wt 166 lb 6.4 oz (75.5 kg)   BMI 26.06 kg/m    Subjective:   Patient ID: David Randall, male    DOB: [birth year], 73 y.o.   MRN: [id number]  HPI: David Randall is a 73 y.o. male presenting on 02/18/2019 for Annual Exam   HPI Adult well exam Patient is coming in for adult well exam and physical.  He denies any major health issues or concerns.  GERD Patient is currently on omeprazole.  She denies any major symptoms or abdominal pain or belching or burping. She denies any blood in her stool or lightheadedness or dizziness.   Hyperlipidemia Patient is coming in for recheck of his hyperlipidemia. The patient is currently taking omega-3's and fenofibrate. They deny any issues with myalgias or history of liver damage from it. They deny any focal numbness or weakness or chest pain.   Relevant past medical, surgical, family and social history reviewed and updated as indicated. Interim medical history since our last visit reviewed. Allergies and medications reviewed and updated.  Review of Systems  Constitutional: Negative for chills and fever.  HENT: Negative for ear pain and tinnitus.   Eyes: Negative for pain and visual disturbance.  Respiratory: Negative for cough, shortness of breath and wheezing.   Cardiovascular: Negative for chest pain, palpitations and leg swelling.  Gastrointestinal: Negative for abdominal pain, blood in stool, constipation and diarrhea.  Genitourinary: Negative for dysuria and hematuria.  Musculoskeletal: Negative for back pain, gait problem and myalgias.  Skin: Negative for rash.  Neurological: Negative for dizziness, weakness, light-headedness and headaches.  Psychiatric/Behavioral: Negative for suicidal ideas.  All other systems reviewed and are negative.   Per HPI unless specifically indicated above   Allergies as of 02/18/2019      Reactions   Statins Other (See Comments)    Myalgias, fatigue, depression   Niacin And Related Rash   Rash and itching      Medication List       Accurate as of February 18, 2019  4:36 PM. If you have any questions, ask your nurse or doctor.        STOP taking these medications   fenofibrate 160 MG tablet Stopped by: Fransisca Kaufmann Keyuana Wank, MD     TAKE these medications   b complex vitamins capsule Take 1 capsule by mouth daily.   clobetasol cream 0.05 % Commonly known as: TEMOVATE Apply 1 application topically 2 (two) times daily as needed.   Fish Oil 1000 MG Caps Take by mouth. Takes 2 tablets daily   fluticasone 50 MCG/ACT nasal spray Commonly known as: FLONASE USE 2 SPRAYS IN EACH NOSTRIL ONCE DAILY.   Glucosamine-Chondroitin 1500-1200 MG/30ML Liqd Take by mouth.   hydrocortisone 2.5 % cream Apply 1 application topically 3 (three) times daily. Proctozone   loratadine 10 MG tablet Commonly known as: CLARITIN Take 10 mg by mouth daily.   LYCOPENE PO Take by mouth.   magnesium citrate Soln Take 1 Bottle by mouth once.   multivitamin capsule Take 1 capsule by mouth daily.   omeprazole 40 MG capsule Commonly known as: PRILOSEC TAKE (1) CAPSULE DAILY   polycarbophil 625 MG tablet Commonly known as: FIBERCON Take 625 mg by mouth daily.   PROBIOTIC PO Take by mouth.   Proctozone-HC 2.5 % rectal cream Generic drug: hydrocortisone APPLY RECTALLY 3 TIMES A DAY AS  DIRECTED        Objective:   BP 105/62   Pulse 60   Temp (!) 97 F (36.1 C) (Oral)   Ht _0  (1.702 m)   Wt 166 lb 6.4 oz (75.5 kg)   BMI 26.06 kg/m   Wt Readings from Last 3 Encounters:  02/18/19 166 lb 6.4 oz (75.5 kg)  10/27/17 165 lb (74.8 kg)  08/20/17 171 lb (77.6 kg)    Physical Exam Vitals signs and nursing note reviewed.  Constitutional:      General: He is not in acute distress.    Appearance: He is well-developed. He is not diaphoretic.  Eyes:     General: No scleral icterus.       Right eye: No discharge.      Conjunctiva/sclera: Conjunctivae normal.     Pupils: Pupils are equal, round, and reactive to light.  Neck:     Musculoskeletal: Neck supple.     Thyroid: No thyromegaly.  Cardiovascular:     Rate and Rhythm: Normal rate and regular rhythm.     Heart sounds: Normal heart sounds. No murmur.  Pulmonary:     Effort: Pulmonary effort is normal. No respiratory distress.     Breath sounds: Normal breath sounds. No wheezing.  Musculoskeletal: Normal range of motion.  Lymphadenopathy:     Cervical: No cervical adenopathy.  Skin:    General: Skin is warm and dry.     Findings: No rash.  Neurological:     Mental Status: He is alert and oriented to person, place, and time.     Coordination: Coordination normal.  Psychiatric:        Behavior: Behavior normal.     Results for orders placed or performed in visit on 10/23/18  CMP14+EGFR  Result Value Ref Range   Glucose 84 65 - 99 mg/dL   BUN 20 8 - 27 mg/dL   Creatinine, Ser 1.04 0.76 - 1.27 mg/dL   GFR calc non Af Amer 72 >59 mL/min/1.73   GFR calc Af Amer 84 >59 mL/min/1.73   BUN/Creatinine Ratio 19 10 - 24   Sodium 140 134 - 144 mmol/L   Potassium 4.3 3.5 - 5.2 mmol/L   Chloride 102 96 - 106 mmol/L   CO2 21 20 - 29 mmol/L   Calcium 10.0 8.6 - 10.2 mg/dL   Total Protein 7.2 6.0 - 8.5 g/dL   Albumin 4.6 3.8 - 4.8 g/dL   Globulin, Total 2.6 1.5 - 4.5 g/dL   Albumin/Globulin Ratio 1.8 1.2 - 2.2   Bilirubin Total 0.3 0.0 - 1.2 mg/dL   Alkaline Phosphatase 44 39 - 117 IU/L   AST 26 0 - 40 IU/L   ALT 23 0 - 44 IU/L  CBC with Differential/Platelet  Result Value Ref Range   WBC 6.0 3.4 - 10.8 x10E3/uL   RBC 5.21 4.14 - 5.80 x10E6/uL   Hemoglobin 14.9 13.0 - 17.7 g/dL   Hematocrit 44.1 37.5 - 51.0 %   MCV 85 79 - 97 fL   MCH 28.6 26.6 - 33.0 pg   MCHC 33.8 31.5 - 35.7 g/dL   RDW 13.1 11.6 - 15.4 %   Platelets 292 150 - 450 x10E3/uL   Neutrophils 44 Not Estab. %   Lymphs 34 Not Estab. %   Monocytes 11 Not Estab. %   Eos 9 Not  Estab. %   Basos 2 Not Estab. %   Neutrophils Absolute 2.6 1.4 - 7.0 x10E3/uL   Lymphocytes Absolute  2.0 0.7 - 3.1 x10E3/uL   Monocytes Absolute 0.7 0.1 - 0.9 x10E3/uL   EOS (ABSOLUTE) 0.5 (H) 0.0 - 0.4 x10E3/uL   Basophils Absolute 0.1 0.0 - 0.2 x10E3/uL   Immature Granulocytes 0 Not Estab. %   Immature Grans (Abs) 0.0 0.0 - 0.1 x10E3/uL  Lipid panel  Result Value Ref Range   Cholesterol, Total 204 (H) 100 - 199 mg/dL   Triglycerides 115 0 - 149 mg/dL   HDL 30 (L) >39 mg/dL   VLDL Cholesterol Cal 23 5 - 40 mg/dL   LDL Calculated 151 (H) 0 - 99 mg/dL   Chol/HDL Ratio 6.8 (H) 0.0 - 5.0 ratio  PSA, total and free  Result Value Ref Range   Prostate Specific Ag, Serum 1.6 0.0 - 4.0 ng/mL   PSA, Free 0.41 N/A ng/mL   PSA, Free Pct 25.6 %    Assessment & Plan:   Problem List Items Addressed This Visit      Digestive   GERD (gastroesophageal reflux disease)     Other   Hyperlipidemia    Other Visit Diagnoses    Well adult exam    -  Primary   Prostate cancer screening          Patient had blood work in March and everything except his cholesterol being mildly elevated. Follow up plan: Return in about 1 year (around 02/18/2020), or if symptoms worsen or fail to improve, for well adult exam.  Counseling provided for all of the vaccine components No orders of the defined types were placed in this encounter.   Caryl Pina, MD [facility] 02/18/2019, 4:36 PM

## 2019-02-22 ENCOUNTER — Other Ambulatory Visit: Payer: Self-pay | Admitting: Family Medicine

## 2019-05-21 ENCOUNTER — Other Ambulatory Visit: Payer: Self-pay | Admitting: Family Medicine

## 2019-06-10 ENCOUNTER — Other Ambulatory Visit: Payer: Self-pay | Admitting: Family Medicine

## 2019-06-25 ENCOUNTER — Other Ambulatory Visit: Payer: Self-pay

## 2019-06-25 ENCOUNTER — Encounter: Payer: Self-pay | Admitting: *Deleted

## 2019-06-25 DIAGNOSIS — Z20822 Contact with and (suspected) exposure to covid-19: Secondary | ICD-10-CM

## 2019-06-29 LAB — NOVEL CORONAVIRUS, NAA: SARS-CoV-2, NAA: NOT DETECTED

## 2019-07-02 ENCOUNTER — Other Ambulatory Visit: Payer: Self-pay

## 2019-08-19 ENCOUNTER — Other Ambulatory Visit: Payer: Self-pay | Admitting: Family Medicine

## 2019-08-20 NOTE — Telephone Encounter (Signed)
OV 02/18/19 rtc 1 yr

## 2019-09-01 ENCOUNTER — Other Ambulatory Visit: Payer: Self-pay | Admitting: Family Medicine

## 2019-09-21 DIAGNOSIS — Z23 Encounter for immunization: Secondary | ICD-10-CM | POA: Diagnosis not present

## 2019-10-19 DIAGNOSIS — Z23 Encounter for immunization: Secondary | ICD-10-CM | POA: Diagnosis not present

## 2019-11-16 ENCOUNTER — Other Ambulatory Visit: Payer: Self-pay | Admitting: Family Medicine

## 2020-02-10 ENCOUNTER — Other Ambulatory Visit: Payer: Self-pay | Admitting: Family Medicine

## 2020-02-17 ENCOUNTER — Other Ambulatory Visit: Payer: PPO

## 2020-02-17 ENCOUNTER — Other Ambulatory Visit: Payer: Self-pay | Admitting: Family Medicine

## 2020-02-17 ENCOUNTER — Other Ambulatory Visit: Payer: Self-pay

## 2020-02-17 DIAGNOSIS — K219 Gastro-esophageal reflux disease without esophagitis: Secondary | ICD-10-CM | POA: Diagnosis not present

## 2020-02-17 DIAGNOSIS — Z8042 Family history of malignant neoplasm of prostate: Secondary | ICD-10-CM | POA: Diagnosis not present

## 2020-02-17 DIAGNOSIS — E782 Mixed hyperlipidemia: Secondary | ICD-10-CM

## 2020-02-17 NOTE — Progress Notes (Signed)
Placed lab orders for the patient 

## 2020-02-18 LAB — CMP14+EGFR
ALT: 15 IU/L (ref 0–44)
AST: 22 IU/L (ref 0–40)
Albumin/Globulin Ratio: 2 (ref 1.2–2.2)
Albumin: 4.5 g/dL (ref 3.7–4.7)
Alkaline Phosphatase: 50 IU/L (ref 48–121)
BUN/Creatinine Ratio: 22 (ref 10–24)
BUN: 21 mg/dL (ref 8–27)
Bilirubin Total: 0.2 mg/dL (ref 0.0–1.2)
CO2: 23 mmol/L (ref 20–29)
Calcium: 9.6 mg/dL (ref 8.6–10.2)
Chloride: 101 mmol/L (ref 96–106)
Creatinine, Ser: 0.94 mg/dL (ref 0.76–1.27)
GFR calc Af Amer: 94 mL/min/{1.73_m2} (ref >59)
GFR calc non Af Amer: 81 mL/min/{1.73_m2} (ref >59)
Globulin, Total: 2.3 g/dL (ref 1.5–4.5)
Glucose: 78 mg/dL (ref 65–99)
Potassium: 4.4 mmol/L (ref 3.5–5.2)
Sodium: 139 mmol/L (ref 134–144)
Total Protein: 6.8 g/dL (ref 6.0–8.5)

## 2020-02-18 LAB — CBC WITH DIFFERENTIAL/PLATELET
Basophils Absolute: 0.1 10*3/uL (ref 0.0–0.2)
Basos: 2 %
EOS (ABSOLUTE): 0.3 10*3/uL (ref 0.0–0.4)
Eos: 6 %
Hematocrit: 43.9 % (ref 37.5–51.0)
Hemoglobin: 14.6 g/dL (ref 13.0–17.7)
Immature Grans (Abs): 0 10*3/uL (ref 0.0–0.1)
Immature Granulocytes: 0 %
Lymphocytes Absolute: 2 10*3/uL (ref 0.7–3.1)
Lymphs: 32 %
MCH: 27.8 pg (ref 26.6–33.0)
MCHC: 33.3 g/dL (ref 31.5–35.7)
MCV: 84 fL (ref 79–97)
Monocytes Absolute: 0.7 10*3/uL (ref 0.1–0.9)
Monocytes: 11 %
Neutrophils Absolute: 3 10*3/uL (ref 1.4–7.0)
Neutrophils: 49 %
Platelets: 294 10*3/uL (ref 150–450)
RBC: 5.25 x10E6/uL (ref 4.14–5.80)
RDW: 13.4 % (ref 11.6–15.4)
WBC: 6.1 10*3/uL (ref 3.4–10.8)

## 2020-02-18 LAB — LIPID PANEL
Chol/HDL Ratio: 6.5 ratio — ABNORMAL HIGH (ref 0.0–5.0)
Cholesterol, Total: 194 mg/dL (ref 100–199)
HDL: 30 mg/dL — ABNORMAL LOW (ref >39)
LDL Chol Calc (NIH): 140 mg/dL — ABNORMAL HIGH (ref 0–99)
Triglycerides: 131 mg/dL (ref 0–149)
VLDL Cholesterol Cal: 24 mg/dL (ref 5–40)

## 2020-02-18 LAB — PSA, TOTAL AND FREE
PSA, Free Pct: 25.6 %
PSA, Free: 0.46 ng/mL
Prostate Specific Ag, Serum: 1.8 ng/mL (ref 0.0–4.0)

## 2020-02-21 ENCOUNTER — Encounter: Payer: PPO | Admitting: Family Medicine

## 2020-02-24 ENCOUNTER — Encounter: Payer: Self-pay | Admitting: Family Medicine

## 2020-02-24 ENCOUNTER — Ambulatory Visit (INDEPENDENT_AMBULATORY_CARE_PROVIDER_SITE_OTHER): Payer: PPO | Admitting: Family Medicine

## 2020-02-24 ENCOUNTER — Other Ambulatory Visit: Payer: Self-pay

## 2020-02-24 VITALS — BP 109/68 | HR 57 | Temp 97.6°F | Ht 67.0 in | Wt 163.0 lb

## 2020-02-24 DIAGNOSIS — Z0000 Encounter for general adult medical examination without abnormal findings: Secondary | ICD-10-CM

## 2020-02-24 DIAGNOSIS — E782 Mixed hyperlipidemia: Secondary | ICD-10-CM

## 2020-02-24 DIAGNOSIS — K219 Gastro-esophageal reflux disease without esophagitis: Secondary | ICD-10-CM | POA: Diagnosis not present

## 2020-02-24 DIAGNOSIS — Z0001 Encounter for general adult medical examination with abnormal findings: Secondary | ICD-10-CM

## 2020-02-24 MED ORDER — OMEPRAZOLE 40 MG PO CPDR: 40.0000 mg | DELAYED_RELEASE_CAPSULE | Freq: Every day | ORAL | 3 refills | Status: DC

## 2020-02-24 MED ORDER — CLOBETASOL PROPIONATE 0.05 % EX CREA: 1.0000 "application " | TOPICAL_CREAM | Freq: Two times a day (BID) | CUTANEOUS | 3 refills | Status: DC | PRN

## 2020-02-24 MED ORDER — FENOFIBRATE 160 MG PO TABS: 160.0000 mg | ORAL_TABLET | Freq: Every day | ORAL | 3 refills | Status: DC

## 2020-02-24 MED ORDER — LIVALO 2 MG PO TABS: 2.0000 mg | ORAL_TABLET | Freq: Every day | ORAL | 11 refills | Status: DC

## 2020-02-24 NOTE — Progress Notes (Signed)
BP 109/68   Pulse (!) 57   Temp 97.6 F (36.4 C)   Ht _0  (1.702 m)   Wt 163 lb (73.9 kg)   SpO2 97%   BMI 25.53 kg/m    Subjective:   Patient ID: David Randall, male    DOB: 1946-06-08, 74 y.o.   MRN: [id number]  HPI: David Randall is a 74 y.o. male presenting on 02/24/2020 for Medical Management of Chronic Issues (CPE) and Hyperlipidemia   HPI Patient is coming in today for well adult exam and physical recheck chronic issues including hyperlipidemia.  Patient has been intolerant of some statins in the past and he thinks he tried Crestor and pravastatin and Zetia and is now taking fenofibrate and fish oil.  His LDL is still very elevated in the 140 range and has not come down substantially with the diet and exercise and he is relatively thinner and trying to keep active.  Patient does have a higher cardiac risk.  Relevant past medical, surgical, family and social history reviewed and updated as indicated. Interim medical history since our last visit reviewed. Allergies and medications reviewed and updated.  Review of Systems  Constitutional: Negative for chills and fever.  HENT: Negative for ear pain and tinnitus.   Eyes: Negative for pain.  Respiratory: Negative for cough, shortness of breath and wheezing.   Cardiovascular: Negative for chest pain, palpitations and leg swelling.  Gastrointestinal: Negative for abdominal pain, blood in stool, constipation and diarrhea.  Genitourinary: Negative for dysuria and hematuria.  Musculoskeletal: Negative for back pain, gait problem and myalgias.  Skin: Negative for rash.  Neurological: Negative for dizziness, weakness and headaches.  Psychiatric/Behavioral: Negative for suicidal ideas.  All other systems reviewed and are negative.   Per HPI unless specifically indicated above   Allergies as of 02/24/2020      Reactions   Statins Other (See Comments)   Myalgias, fatigue, depression   Niacin And Related Rash   Rash and itching        Medication List       Accurate as of February 24, 2020 10:15 AM. If you have any questions, ask your nurse or doctor.        b complex vitamins capsule Take 1 capsule by mouth daily.   clobetasol cream 0.05 % Commonly known as: TEMOVATE Apply 1 application topically 2 (two) times daily as needed.   fenofibrate 160 MG tablet TAKE 1 TABLET DAILY   ferrous sulfate 325 (65 FE) MG tablet Take 325 mg by mouth daily with breakfast.   Fish Oil 1000 MG Caps Take by mouth. Takes 2 tablets daily   fluticasone 50 MCG/ACT nasal spray Commonly known as: FLONASE USE 2 SPRAYS IN EACH NOSTRIL ONCE DAILY.   Glucosamine-Chondroitin 1500-1200 MG/30ML Liqd Take by mouth.   hydrocortisone 2.5 % cream Apply 1 application topically 3 (three) times daily. Proctozone   loratadine 10 MG tablet Commonly known as: CLARITIN Take 10 mg by mouth daily.   LYCOPENE PO Take by mouth.   magnesium citrate Soln Take 1 Bottle by mouth once.   multivitamin capsule Take 1 capsule by mouth daily.   omeprazole 40 MG capsule Commonly known as: PRILOSEC TAKE (1) CAPSULE DAILY   polycarbophil 625 MG tablet Commonly known as: FIBERCON Take 625 mg by mouth daily.   PROBIOTIC PO Take by mouth.   Proctozone-HC 2.5 % rectal cream Generic drug: hydrocortisone APPLY RECTALLY 3 TIMES A DAY AS DIRECTED  Objective:   BP 109/68   Pulse (!) 57   Temp 97.6 F (36.4 C)   Ht _0  (1.702 m)   Wt 163 lb (73.9 kg)   SpO2 97%   BMI 25.53 kg/m   Wt Readings from Last 3 Encounters:  02/24/20 163 lb (73.9 kg)  02/18/19 166 lb 6.4 oz (75.5 kg)  10/27/17 165 lb (74.8 kg)    Physical Exam Vitals and nursing note reviewed.  Constitutional:      General: He is not in acute distress.    Appearance: He is well-developed. He is not diaphoretic.  HENT:     Right Ear: External ear normal.     Left Ear: External ear normal.     Nose: Nose normal.     Mouth/Throat:     Pharynx: No oropharyngeal  exudate.  Eyes:     General: No scleral icterus.       Right eye: No discharge.     Conjunctiva/sclera: Conjunctivae normal.     Pupils: Pupils are equal, round, and reactive to light.  Neck:     Thyroid: No thyromegaly.  Cardiovascular:     Rate and Rhythm: Normal rate and regular rhythm.     Heart sounds: Normal heart sounds. No murmur heard.   Pulmonary:     Effort: Pulmonary effort is normal. No respiratory distress.     Breath sounds: Normal breath sounds. No wheezing.  Abdominal:     General: Bowel sounds are normal. There is no distension.     Palpations: Abdomen is soft.     Tenderness: There is no abdominal tenderness. There is no guarding or rebound.  Musculoskeletal:        General: Normal range of motion.     Cervical back: Neck supple.  Lymphadenopathy:     Cervical: No cervical adenopathy.  Skin:    General: Skin is warm and dry.     Findings: No rash.  Neurological:     Mental Status: He is alert and oriented to person, place, and time.     Coordination: Coordination normal.  Psychiatric:        Behavior: Behavior normal.     Results for orders placed or performed in visit on 02/17/20  CBC with Differential/Platelet  Result Value Ref Range   WBC 6.1 3.4 - 10.8 x10E3/uL   RBC 5.25 4.14 - 5.80 x10E6/uL   Hemoglobin 14.6 13.0 - 17.7 g/dL   Hematocrit 43.9 37.5 - 51.0 %   MCV 84 79 - 97 fL   MCH 27.8 26.6 - 33.0 pg   MCHC 33.3 31 - 35 g/dL   RDW 13.4 11.6 - 15.4 %   Platelets 294 150 - 450 x10E3/uL   Neutrophils 49 Not Estab. %   Lymphs 32 Not Estab. %   Monocytes 11 Not Estab. %   Eos 6 Not Estab. %   Basos 2 Not Estab. %   Neutrophils Absolute 3.0 1 - 7 x10E3/uL   Lymphocytes Absolute 2.0 0 - 3 x10E3/uL   Monocytes Absolute 0.7 0 - 0 x10E3/uL   EOS (ABSOLUTE) 0.3 0.0 - 0.4 x10E3/uL   Basophils Absolute 0.1 0 - 0 x10E3/uL   Immature Granulocytes 0 Not Estab. %   Immature Grans (Abs) 0.0 0.0 - 0.1 x10E3/uL  CMP14+EGFR  Result Value Ref Range    Glucose 78 65 - 99 mg/dL   BUN 21 8 - 27 mg/dL   Creatinine, Ser 0.94 0.76 - 1.27 mg/dL   GFR  calc non Af Amer 81 >59 mL/min/1.73   GFR calc Af Amer 94 >59 mL/min/1.73   BUN/Creatinine Ratio 22 10 - 24   Sodium 139 134 - 144 mmol/L   Potassium 4.4 3.5 - 5.2 mmol/L   Chloride 101 96 - 106 mmol/L   CO2 23 20 - 29 mmol/L   Calcium 9.6 8.6 - 10.2 mg/dL   Total Protein 6.8 6.0 - 8.5 g/dL   Albumin 4.5 3.7 - 4.7 g/dL   Globulin, Total 2.3 1.5 - 4.5 g/dL   Albumin/Globulin Ratio 2.0 1.2 - 2.2   Bilirubin Total 0.2 0.0 - 1.2 mg/dL   Alkaline Phosphatase 50 48 - 121 IU/L   AST 22 0 - 40 IU/L   ALT 15 0 - 44 IU/L  Lipid panel  Result Value Ref Range   Cholesterol, Total 194 100 - 199 mg/dL   Triglycerides 131 0 - 149 mg/dL   HDL 30 (L) >39 mg/dL   VLDL Cholesterol Cal 24 5 - 40 mg/dL   LDL Chol Calc (NIH) 140 (H) 0 - 99 mg/dL   Chol/HDL Ratio 6.5 (H) 0.0 - 5.0 ratio  PSA, total and free  Result Value Ref Range   Prostate Specific Ag, Serum 1.8 0.0 - 4.0 ng/mL   PSA, Free 0.46 N/A ng/mL   PSA, Free Pct 25.6 %    Assessment & Plan:   Problem List Items Addressed This Visit      Digestive   GERD (gastroesophageal reflux disease)   Relevant Medications   omeprazole (PRILOSEC) 40 MG capsule     Other   Hyperlipidemia   Relevant Medications   fenofibrate 160 MG tablet   Pitavastatin Calcium (LIVALO) 2 MG TABS    Other Visit Diagnoses    Well adult exam    -  Primary      The 10-year ASCVD risk score Mikey Bussing DC Jr., et al., 2013) is: 18.1%   Values used to calculate the score:     Age: 62 years     Sex: Male     Is Non-Hispanic African American: No     Diabetic: No     Tobacco smoker: No     Systolic Blood Pressure: 858 mmHg     Is BP treated: No     HDL Cholesterol: 30 mg/dL     Total Cholesterol: 194 mg/dL   Blood work looks great except cholesterol, recommended to try Livalo, also recommended to cut his fenofibrate in half and only take half of it.  Send a  prescription for Livalo to the pharmacy.  Patient has already tried and failed in the past Crestor and pravastatin and Zetia and had issues with myalgias but these.  Follow up plan: Return in about 6 months (around 08/26/2020), or if symptoms worsen or fail to improve, for Hyperlipidemia recheck.  Counseling provided for all of the vaccine components No orders of the defined types were placed in this encounter.   Caryl Pina, MD [facility] 02/24/2020, 10:15 AM

## 2020-08-18 ENCOUNTER — Telehealth: Payer: Self-pay | Admitting: Family Medicine

## 2020-08-18 DIAGNOSIS — E782 Mixed hyperlipidemia: Secondary | ICD-10-CM

## 2020-08-18 DIAGNOSIS — K219 Gastro-esophageal reflux disease without esophagitis: Secondary | ICD-10-CM

## 2020-08-18 DIAGNOSIS — Z8042 Family history of malignant neoplasm of prostate: Secondary | ICD-10-CM

## 2020-08-18 NOTE — Telephone Encounter (Signed)
Pt would like to have labs completed before the day of appt on 1/17 @ 9:10, requesting that an order be put in

## 2020-08-21 NOTE — Telephone Encounter (Signed)
Placed labs for the patient so he can come and get them done prior to the visit.

## 2020-08-21 NOTE — Telephone Encounter (Signed)
Patient aware and verbalized understanding. °

## 2020-08-24 ENCOUNTER — Other Ambulatory Visit: Payer: PPO

## 2020-08-24 ENCOUNTER — Other Ambulatory Visit: Payer: Self-pay

## 2020-08-28 ENCOUNTER — Ambulatory Visit: Payer: PPO | Admitting: Family Medicine

## 2021-01-24 ENCOUNTER — Other Ambulatory Visit: Payer: Self-pay | Admitting: Family Medicine

## 2021-01-29 ENCOUNTER — Telehealth: Payer: Self-pay | Admitting: Family Medicine

## 2021-01-29 MED ORDER — FLUTICASONE PROPIONATE 50 MCG/ACT NA SUSP: 2.0000 | Freq: Every day | NASAL | 0 refills | Status: DC

## 2021-01-29 NOTE — Telephone Encounter (Signed)
LMOVM refill sent to pharmacy 

## 2021-01-29 NOTE — Telephone Encounter (Signed)
  Prescription Request  01/29/2021  What is the name of the medication or equipment? Fluticasone 50 MCG/ACT nasal spray. Patient has appt 8-4 and needs a refill to get him through  Have you contacted your pharmacy to request a refill? (if applicable) YES  Which pharmacy would you like this sent to? [facility]   Patient notified that their request is being sent to the clinical staff for review and that they should receive a response within 2 business days.

## 2021-02-06 ENCOUNTER — Encounter: Payer: Self-pay | Admitting: Internal Medicine

## 2021-02-06 ENCOUNTER — Ambulatory Visit (INDEPENDENT_AMBULATORY_CARE_PROVIDER_SITE_OTHER): Payer: PPO

## 2021-02-06 VITALS — Ht 67.0 in | Wt 158.0 lb

## 2021-02-06 DIAGNOSIS — Z0000 Encounter for general adult medical examination without abnormal findings: Secondary | ICD-10-CM | POA: Diagnosis not present

## 2021-02-06 NOTE — Progress Notes (Signed)
Subjective:   David Randall is a 75 y.o. male who presents for Medicare Annual/Subsequent preventive examination.  Virtual Visit via Telephone Note  I connected with  [name] on 02/06/21 at  2:45 PM EDT by telephone and verified that I am speaking with the correct person using two identifiers.  Location: Patient: Home Provider: [facility] Persons participating in the virtual visit: patient/Nurse Health Advisor   I discussed the limitations, risks, security and privacy concerns of performing an evaluation and management service by telephone and the availability of in person appointments. The patient expressed understanding and agreed to proceed.  Interactive audio and video telecommunications were attempted between this nurse and patient, however failed, due to patient having technical difficulties OR patient did not have access to video capability.  We continued and completed visit with audio only.  Some vital signs may be absent or patient reported.   Tameah Mihalko E Athira Janowicz, LPN   Review of Systems     Cardiac Risk Factors include: advanced age (>56men, >29 women);dyslipidemia;male gender     Objective:    Today's Vitals   02/06/21 1335  Weight: 158 lb (71.7 kg)  Height: 5\' 7"  (1.702 m)   Body mass index is 24.75 kg/m.  Advanced Directives 02/06/2021 08/20/2017 02/26/2016  Does Patient Have a Medical Advance Directive? Yes Yes Yes  Type of Paramedic of [place];Living will [place];Living will Living will  Does patient want to make changes to medical advance directive? - No - Patient declined -  Copy of [place] in Chart? No - copy requested No - copy requested No - copy requested    Current Medications (verified) Outpatient Encounter Medications as of 02/06/2021  Medication Sig   b complex vitamins capsule Take 1 capsule by mouth daily.   clobetasol cream (TEMOVATE) 1.61 % Apply 1 application topically 2 (two) times  daily as needed.   fenofibrate 160 MG tablet Take 1 tablet (160 mg total) by mouth daily.   ferrous sulfate 325 (65 FE) MG tablet Take 325 mg by mouth daily with breakfast.   fluticasone (FLONASE) 50 MCG/ACT nasal spray Place 2 sprays into both nostrils daily.   Glucosamine-Chondroitin 1500-1200 MG/30ML LIQD Take by mouth.     hydrocortisone 2.5 % cream Apply 1 application topically 3 (three) times daily. Proctozone   loratadine (CLARITIN) 10 MG tablet Take 10 mg by mouth daily.   LYCOPENE PO Take by mouth.   magnesium citrate SOLN Take 1 Bottle by mouth once.   Multiple Vitamin (MULTIVITAMIN) capsule Take 1 capsule by mouth daily.     Omega-3 Fatty Acids (FISH OIL) 1000 MG CAPS Take by mouth. Takes 2 tablets daily   omeprazole (PRILOSEC) 40 MG capsule Take 1 capsule (40 mg total) by mouth daily.   polycarbophil (FIBERCON) 625 MG tablet Take 625 mg by mouth daily.     Probiotic Product (PROBIOTIC PO) Take by mouth.     PROCTOZONE-HC 2.5 % rectal cream APPLY RECTALLY 3 TIMES A DAY AS DIRECTED   Pitavastatin Calcium (LIVALO) 2 MG TABS Take 1 tablet (2 mg total) by mouth daily. (Patient not taking: Reported on 02/06/2021)   No facility-administered encounter medications on file as of 02/06/2021.    Allergies (verified) Statins and Niacin and related   History: Past Medical History:  Diagnosis Date   Allergy    Bradycardia    GERD (gastroesophageal reflux disease)    Hemorrhoids    Hyperlipidemia    Inguinal hernia  Past Surgical History:  Procedure Laterality Date   COLONOSCOPY N/A 02/26/2016   Procedure: COLONOSCOPY;  Surgeon: Danie Binder, MD;  [facility];  Service: Endoscopy;  Laterality: N/A;  8:30 Am   HERNIA REPAIR     TONSILLECTOMY  1969   Family History  Problem Relation Age of Onset   Cancer Mother        breast, bone   Heart disease Father    Prostate cancer Brother    Cancer Brother        prostate   Cancer Sister        breast   Social History    Socioeconomic History   Marital status: Married    Spouse name: Not on file   Number of children: 1   Years of education: Not on file   Highest education level: Not on file  Occupational History   Not on file  Tobacco Use   Smoking status: Former    Pack years: 0.00    Types: Pipe    Quit date: 08/12/1984    Years since quitting: 36.5   Smokeless tobacco: Never  Vaping Use   Vaping Use: Never used  Substance and Sexual Activity   Alcohol use: No   Drug use: No   Sexual activity: Yes  Other Topics Concern   Not on file  Social History Narrative   Lives home with wife - foster children   Son lives in [place]   Retired, but farms on the side   Social Determinants of Health   Financial Resource Strain: Low Risk    Difficulty of Paying Living Expenses: Not hard at all  Food Insecurity: No Food Insecurity   Worried About Charity fundraiser in the Last Year: Never true   Arboriculturist in the Last Year: Never true  Transportation Needs: No Transportation Needs   Lack of Transportation (Medical): No   Lack of Transportation (Non-Medical): No  Physical Activity: Sufficiently Active   Days of Exercise per Week: 7 days   Minutes of Exercise per Session: 60 min  Stress: No Stress Concern Present   Feeling of Stress : Not at all  Social Connections: Moderately Integrated   Frequency of Communication with Friends and Family: More than three times a week   Frequency of Social Gatherings with Friends and Family: More than three times a week   Attends Religious Services: Never   Marine scientist or Organizations: Yes   Attends Music therapist: 1 to 4 times per year   Marital Status: Married    Tobacco Counseling Counseling given: Not Answered   Clinical Intake:  Pre-visit preparation completed: Yes  Pain : No/denies pain     BMI - recorded: 24.75 Nutritional Status: BMI of 19-24  Normal Nutritional Risks: None Diabetes: No  How often do  you need to have someone help you when you read instructions, pamphlets, or other written materials from your doctor or pharmacy?: 1 - Never  Diabetic? No  Interpreter Needed?: No  Information entered by :: Tamara Monteith, LPN   Activities of Daily Living In your present state of health, do you have any difficulty performing the following activities: 02/06/2021  Hearing? N  Vision? N  Difficulty concentrating or making decisions? N  Walking or climbing stairs? N  Dressing or bathing? N  Doing errands, shopping? N  Preparing Food and eating ? N  Using the Toilet? N  In the past six months,  have you accidently leaked urine? N  Do you have problems with loss of bowel control? N  Managing your Medications? N  Managing your Finances? N  Housekeeping or managing your Housekeeping? N  Some recent data might be hidden    Patient Care Team: Dettinger, Fransisca Kaufmann, MD as PCP - General (Family Medicine) Rutherford Guys, MD as Consulting Physician (Ophthalmology) Satira Sark, MD as Consulting Physician (Cardiology) Danie Binder, MD (Inactive) as Consulting Physician (Gastroenterology)  Indicate any recent Medical Services you may have received from other than [facility] providers in the past year (date may be approximate).     Assessment:   This is a routine wellness examination for David Randall.  Hearing/Vision screen Hearing Screening - Comments:: Denies hearing difficulties  Vision Screening - Comments:: Wears eyeglasses - up to date with annual eye exams at [facility] in [place] issues and exercise activities discussed: Current Exercise Habits: Home exercise routine, Type of exercise: walking;strength training/weights, Time (Minutes): 60, Frequency (Times/Week): 7, Weekly Exercise (Minutes/Week): 420, Intensity: Moderate, Exercise limited by: None identified   Goals Addressed             This Visit's Progress    DIET - REDUCE SUGAR INTAKE   On track    Cut back on processed  snacks and desserts        Depression Screen PHQ 2/9 Scores 02/06/2021 02/24/2020 02/18/2019 10/27/2017 08/20/2017 04/29/2017  PHQ - 2 Score 0 0 0 0 0 0    Fall Risk Fall Risk  02/06/2021 02/24/2020 07/02/2019 02/18/2019 10/27/2017  Falls in the past year? 0 0 0 0 Yes  Comment - - Emmi Telephone Survey: data to providers prior to load - -  Number falls in past yr: 0 - - - -  Injury with Fall? 0 - - - -  Risk for fall due to : Impaired vision - - - -  Follow up Falls prevention discussed - - - -    FALL RISK PREVENTION PERTAINING TO THE HOME:  Any stairs in or around the home? Yes  If so, are there any without handrails? No  Home free of loose throw rugs in walkways, pet beds, electrical cords, etc? Yes  Adequate lighting in your home to reduce risk of falls? Yes   ASSISTIVE DEVICES UTILIZED TO PREVENT FALLS:  Life alert? No  Use of a cane, walker or w/c? No  Grab bars in the bathroom? Yes  Shower chair or bench in shower? No  Elevated toilet seat or a handicapped toilet? No   TIMED UP AND GO:  Was the test performed? No . Telephonic visit.  Cognitive Function: Normal cognitive status assessed by direct observation by this Nurse Health Advisor. No abnormalities found.   MMSE - Mini Mental State Exam 08/20/2017  Orientation to time 5  Orientation to Place 5  Registration 3  Attention/ Calculation 5  Recall 3  Language- name 2 objects 2  Language- repeat 1  Language- follow 3 step command 3  Language- read & follow direction 1  Write a sentence 1  Copy design 1  Total score 30     6CIT Screen 02/06/2021  What Year? 0 points  What month? 0 points  What time? 0 points  Count back from 20 0 points  Months in reverse 0 points  Repeat phrase 0 points  Total Score 0    Immunizations Immunization History  Administered Date(s) Administered   Influenza,inj,Quad PF,6+ Mos 05/25/2019   Influenza,inj,quad, With  Preservative 05/29/2017   Influenza-Unspecified 06/22/2018,  06/02/2020   Moderna Sars-Covid-2 Vaccination 09/01/2019, 10/02/2019, 06/20/2020   Pneumococcal Conjugate-13 05/17/2014   Pneumococcal Polysaccharide-23 05/18/2013   Tdap 04/20/2018   Zoster Recombinat (Shingrix) 04/20/2018, 06/22/2018    TDAP status: Up to date  Flu Vaccine status: Up to date  Pneumococcal vaccine status: Up to date  Covid-19 vaccine status: Completed vaccines  Qualifies for Shingles Vaccine? Yes   Zostavax completed Yes   Shingrix Completed?: Yes  Screening Tests Health Maintenance  Topic Date Due   COVID-19 Vaccine (4 - Booster for Moderna series) 10/18/2020   INFLUENZA VACCINE  03/12/2021   COLONOSCOPY (Pts 45-90yrs Insurance coverage will need to be confirmed)  02/25/2026   TETANUS/TDAP  04/20/2028   Hepatitis C Screening  Completed   PNA vac Low Risk Adult  Completed   Zoster Vaccines- Shingrix  Completed   HPV VACCINES  Aged Out    Health Maintenance  Health Maintenance Due  Topic Date Due   COVID-19 Vaccine (4 - Booster for Moderna series) 10/18/2020    Colorectal cancer screening: Type of screening: Colonoscopy. Completed 02/26/2016. Repeat every 10 years  Lung Cancer Screening: (Low Dose CT Chest recommended if Age 32-80 years, 30 pack-year currently smoking OR have quit w/in 15years.) does not qualify.   Additional Screening:  Hepatitis C Screening: does qualify; Completed 04/29/2017  Vision Screening: Recommended annual ophthalmology exams for early detection of glaucoma and other disorders of the eye. Is the patient up to date with their annual eye exam?  Yes  Who is the provider or what is the name of the office in which the patient attends annual eye exams? [facility] If pt is not established with a provider, would they like to be referred to a provider to establish care? No .   Dental Screening: Recommended annual dental exams for proper oral hygiene  Community Resource Referral / Chronic Care Management: CRR required this  visit?  No   CCM required this visit?  No      Plan:     I have personally reviewed and noted the following in the patient's chart:   Medical and social history Use of alcohol, tobacco or illicit drugs  Current medications and supplements including opioid prescriptions. Patient is not currently taking opioid prescriptions. Functional ability and status Nutritional status Physical activity Advanced directives List of other physicians Hospitalizations, surgeries, and ER visits in previous 12 months Vitals Screenings to include cognitive, depression, and falls Referrals and appointments  In addition, I have reviewed and discussed with patient certain preventive protocols, quality metrics, and best practice recommendations. A written personalized care plan for preventive services as well as general preventive health recommendations were provided to patient.     Sandrea Hammond, LPN   7/74/1287   Nurse Notes: None

## 2021-02-06 NOTE — Patient Instructions (Signed)
David Randall , Thank you for taking time to come for your Medicare Wellness Visit. I appreciate your ongoing commitment to your health goals. Please review the following plan we discussed and let me know if I can assist you in the future.   Screening recommendations/referrals: Colonoscopy: Done 02/26/2016 - Repeat in 10 years Recommended yearly ophthalmology/optometry visit for glaucoma screening and checkup Recommended yearly dental visit for hygiene and checkup  Vaccinations: Influenza vaccine: Done 06/02/2020 - Repeat annually Pneumococcal vaccine: Done 05/18/2013 & 05/17/2014 Tdap vaccine: Done 04/20/2018 - Repeat in 10 years Shingles vaccine: Done 04/20/2018 & 06/22/2018   Covid-19: Done 08/2019, 09/2019, & 06/20/2020 - due for second booster  Advanced directives: Please bring a copy of your health care power of attorney and living will to the office to be added to your chart at your convenience.  Conditions/risks identified: Aim for 30 minutes of exercise or brisk walking each day, drink 6-8 glasses of water and eat lots of fruits and vegetables.  Next appointment: Follow up in one year for your annual wellness visit.   Preventive Care 73 Years and Older, Male  Preventive care refers to lifestyle choices and visits with your health care provider that can promote health and wellness. What does preventive care include? A yearly physical exam. This is also called an annual well check. Dental exams once or twice a year. Routine eye exams. Ask your health care provider how often you should have your eyes checked. Personal lifestyle choices, including: Daily care of your teeth and gums. Regular physical activity. Eating a healthy diet. Avoiding tobacco and drug use. Limiting alcohol use. Practicing safe sex. Taking low doses of aspirin every day. Taking vitamin and mineral supplements as recommended by your health care provider. What happens during an annual well check? The services and  screenings done by your health care provider during your annual well check will depend on your age, overall health, lifestyle risk factors, and family history of disease. Counseling  Your health care provider may ask you questions about your: Alcohol use. Tobacco use. Drug use. Emotional well-being. Home and relationship well-being. Sexual activity. Eating habits. History of falls. Memory and ability to understand (cognition). Work and work Statistician. Screening  You may have the following tests or measurements: Height, weight, and BMI. Blood pressure. Lipid and cholesterol levels. These may be checked every 5 years, or more frequently if you are over 25 years old. Skin check. Lung cancer screening. You may have this screening every year starting at age 24 if you have a 30-pack-year history of smoking and currently smoke or have quit within the past 15 years. Fecal occult blood test (FOBT) of the stool. You may have this test every year starting at age 70. Flexible sigmoidoscopy or colonoscopy. You may have a sigmoidoscopy every 5 years or a colonoscopy every 10 years starting at age 27. Prostate cancer screening. Recommendations will vary depending on your family history and other risks. Hepatitis C blood test. Hepatitis B blood test. Sexually transmitted disease (STD) testing. Diabetes screening. This is done by checking your blood sugar (glucose) after you have not eaten for a while (fasting). You may have this done every 1-3 years. Abdominal aortic aneurysm (AAA) screening. You may need this if you are a current or former smoker. Osteoporosis. You may be screened starting at age 17 if you are at high risk. Talk with your health care provider about your test results, treatment options, and if necessary, the need for more tests. Vaccines  Your health care provider may recommend certain vaccines, such as: Influenza vaccine. This is recommended every year. Tetanus, diphtheria, and  acellular pertussis (Tdap, Td) vaccine. You may need a Td booster every 10 years. Zoster vaccine. You may need this after age 29. Pneumococcal 13-valent conjugate (PCV13) vaccine. One dose is recommended after age 34. Pneumococcal polysaccharide (PPSV23) vaccine. One dose is recommended after age 60. Talk to your health care provider about which screenings and vaccines you need and how often you need them. This information is not intended to replace advice given to you by your health care provider. Make sure you discuss any questions you have with your health care provider. Document Released: 08/25/2015 Document Revised: 04/17/2016 Document Reviewed: 05/30/2015 Elsevier Interactive Patient Education  2017 [place] Prevention in the Home Falls can cause injuries. They can happen to people of all ages. There are many things you can do to make your home safe and to help prevent falls. What can I do on the outside of my home? Regularly fix the edges of walkways and driveways and fix any cracks. Remove anything that might make you trip as you walk through a door, such as a raised step or threshold. Trim any bushes or trees on the path to your home. Use bright outdoor lighting. Clear any walking paths of anything that might make someone trip, such as rocks or tools. Regularly check to see if handrails are loose or broken. Make sure that both sides of any steps have handrails. Any raised decks and porches should have guardrails on the edges. Have any leaves, snow, or ice cleared regularly. Use sand or salt on walking paths during winter. Clean up any spills in your garage right away. This includes oil or grease spills. What can I do in the bathroom? Use night lights. Install grab bars by the toilet and in the tub and shower. Do not use towel bars as grab bars. Use non-skid mats or decals in the tub or shower. If you need to sit down in the shower, use a plastic, non-slip stool. Keep  the floor dry. Clean up any water that spills on the floor as soon as it happens. Remove soap buildup in the tub or shower regularly. Attach bath mats securely with double-sided non-slip rug tape. Do not have throw rugs and other things on the floor that can make you trip. What can I do in the bedroom? Use night lights. Make sure that you have a light by your bed that is easy to reach. Do not use any sheets or blankets that are too big for your bed. They should not hang down onto the floor. Have a firm chair that has side arms. You can use this for support while you get dressed. Do not have throw rugs and other things on the floor that can make you trip. What can I do in the kitchen? Clean up any spills right away. Avoid walking on wet floors. Keep items that you use a lot in easy-to-reach places. If you need to reach something above you, use a strong step stool that has a grab bar. Keep electrical cords out of the way. Do not use floor polish or wax that makes floors slippery. If you must use wax, use non-skid floor wax. Do not have throw rugs and other things on the floor that can make you trip. What can I do with my stairs? Do not leave any items on the stairs. Make sure that there are  handrails on both sides of the stairs and use them. Fix handrails that are broken or loose. Make sure that handrails are as long as the stairways. Check any carpeting to make sure that it is firmly attached to the stairs. Fix any carpet that is loose or worn. Avoid having throw rugs at the top or bottom of the stairs. If you do have throw rugs, attach them to the floor with carpet tape. Make sure that you have a light switch at the top of the stairs and the bottom of the stairs. If you do not have them, ask someone to add them for you. What else can I do to help prevent falls? Wear shoes that: Do not have high heels. Have rubber bottoms. Are comfortable and fit you well. Are closed at the toe. Do not  wear sandals. If you use a stepladder: Make sure that it is fully opened. Do not climb a closed stepladder. Make sure that both sides of the stepladder are locked into place. Ask someone to hold it for you, if possible. Clearly mark and make sure that you can see: Any grab bars or handrails. First and last steps. Where the edge of each step is. Use tools that help you move around (mobility aids) if they are needed. These include: Canes. Walkers. Scooters. Crutches. Turn on the lights when you go into a dark area. Replace any light bulbs as soon as they burn out. Set up your furniture so you have a clear path. Avoid moving your furniture around. If any of your floors are uneven, fix them. If there are any pets around you, be aware of where they are. Review your medicines with your doctor. Some medicines can make you feel dizzy. This can increase your chance of falling. Ask your doctor what other things that you can do to help prevent falls. This information is not intended to replace advice given to you by your health care provider. Make sure you discuss any questions you have with your health care provider. Document Released: 05/25/2009 Document Revised: 01/04/2016 Document Reviewed: 09/02/2014 Elsevier Interactive Patient Education  2017 [employer].

## 2021-03-12 ENCOUNTER — Other Ambulatory Visit: Payer: PPO

## 2021-03-12 ENCOUNTER — Other Ambulatory Visit: Payer: Self-pay

## 2021-03-15 ENCOUNTER — Encounter: Payer: Self-pay | Admitting: Family Medicine

## 2021-03-15 ENCOUNTER — Other Ambulatory Visit: Payer: Self-pay

## 2021-03-15 ENCOUNTER — Ambulatory Visit (INDEPENDENT_AMBULATORY_CARE_PROVIDER_SITE_OTHER): Payer: PPO | Admitting: Family Medicine

## 2021-03-15 VITALS — BP 106/64 | HR 69 | Temp 94.0°F | Ht 67.0 in | Wt 164.2 lb

## 2021-03-15 DIAGNOSIS — R509 Fever, unspecified: Secondary | ICD-10-CM

## 2021-03-15 DIAGNOSIS — Z8042 Family history of malignant neoplasm of prostate: Secondary | ICD-10-CM

## 2021-03-15 DIAGNOSIS — E782 Mixed hyperlipidemia: Secondary | ICD-10-CM

## 2021-03-15 DIAGNOSIS — Z0000 Encounter for general adult medical examination without abnormal findings: Secondary | ICD-10-CM | POA: Diagnosis not present

## 2021-03-15 DIAGNOSIS — K219 Gastro-esophageal reflux disease without esophagitis: Secondary | ICD-10-CM | POA: Diagnosis not present

## 2021-03-15 DIAGNOSIS — Z0001 Encounter for general adult medical examination with abnormal findings: Secondary | ICD-10-CM | POA: Diagnosis not present

## 2021-03-15 MED ORDER — FENOFIBRATE 160 MG PO TABS: 160.0000 mg | ORAL_TABLET | Freq: Every day | ORAL | 3 refills | Status: DC

## 2021-03-15 MED ORDER — OMEPRAZOLE 40 MG PO CPDR: 40.0000 mg | DELAYED_RELEASE_CAPSULE | Freq: Every day | ORAL | 3 refills | Status: DC

## 2021-03-15 NOTE — Progress Notes (Addendum)
BP 106/64   Pulse 69   Temp (!) 94 F (34.4 C)   Ht $R'5\' 7"'qI$  (1.702 m)   Wt 164 lb 4 oz (74.5 kg)   SpO2 94%   BMI 25.73 kg/m    Subjective:   Patient ID: David Randall, male    DOB: June 12, 1946, 76 y.o.   MRN: [id number]  HPI: David Randall is a 75 y.o. male presenting on 03/15/2021 for No chief complaint on file.   HPI Physical exam Patient denies any chest pain, shortness of breath, headaches or vision issues, diarrhea, nausea, vomiting, or joint issues.   Patient also complains of fever and constipation and some abdominal discomfort, he does not describe it as pain but more of discomfort has been going on over the past couple days.  He says his fever last night was 103.  His fevers come back down.  He has been taking some fiber to see if that helps with his bowel movements.  He says he has not had good bowel movement at least a few days and he is feeling a lot of pressure which is decreased his appetite and he is feeling achy in his lower extremities.  He denies any cough or congestion.  He did take a COVID test last night and it was negative.  GERD Patient is currently on oMeprazole.  She denies any major symptoms or abdominal pain or belching or burping. She denies any blood in her stool or lightheadedness or dizziness.   Hyperlipidemia Patient is coming in for recheck of his hyperlipidemia. The patient is currently taking fish oil and he tried Livalo but did not tolerate it. They deny any issues with myalgias or history of liver damage from it. They deny any focal numbness or weakness or chest pain.   Relevant past medical, surgical, family and social history reviewed and updated as indicated. Interim medical history since our last visit reviewed. Allergies and medications reviewed and updated.  Review of Systems  Constitutional:  Negative for chills and fever.  Eyes:  Negative for visual disturbance.  Respiratory:  Negative for shortness of breath and wheezing.    Cardiovascular:  Negative for chest pain and leg swelling.  Musculoskeletal:  Negative for back pain and gait problem.  Skin:  Negative for rash.  Neurological:  Negative for dizziness, weakness and light-headedness.  All other systems reviewed and are negative.  Per HPI unless specifically indicated above   Allergies as of 03/15/2021       Reactions   Statins Other (See Comments)   Myalgias, fatigue, depression   Niacin And Related Rash   Rash and itching        Medication List        Accurate as of March 15, 2021 10:32 AM. If you have any questions, ask your nurse or doctor.          STOP taking these medications    ferrous sulfate 325 (65 FE) MG tablet Stopped by: Fransisca Kaufmann Zabdi Mis, MD   hydrocortisone 2.5 % cream Stopped by: Worthy Rancher, MD   Livalo 2 MG Tabs Generic drug: Pitavastatin Calcium Stopped by: Worthy Rancher, MD   Proctozone-HC 2.5 % rectal cream Generic drug: hydrocortisone Stopped by: Fransisca Kaufmann Tyrene Nader, MD       TAKE these medications    b complex vitamins capsule Take 1 capsule by mouth daily.   clobetasol cream 0.05 % Commonly known as: TEMOVATE Apply 1 application topically 2 (two) times daily as needed.  fenofibrate 160 MG tablet Take 1 tablet (160 mg total) by mouth daily.   Fish Oil 1000 MG Caps Take by mouth. Takes 2 tablets daily   fluticasone 50 MCG/ACT nasal spray Commonly known as: FLONASE Place 2 sprays into both nostrils daily.   Glucosamine-Chondroitin 1500-1200 MG/30ML Liqd Take by mouth.   loratadine 10 MG tablet Commonly known as: CLARITIN Take 10 mg by mouth daily.   LYCOPENE PO Take by mouth.   magnesium citrate Soln Take 1 Bottle by mouth once.   multivitamin capsule Take 1 capsule by mouth daily.   omeprazole 40 MG capsule Commonly known as: PRILOSEC Take 1 capsule (40 mg total) by mouth daily.   polycarbophil 625 MG tablet Commonly known as: FIBERCON Take 625 mg by mouth  daily.   PROBIOTIC PO Take by mouth.         Objective:   BP 106/64   Pulse 69   Temp (!) 94 F (34.4 C)   Ht $R'5\' 7"'MX$  (1.702 m)   Wt 164 lb 4 oz (74.5 kg)   SpO2 94%   BMI 25.73 kg/m   Wt Readings from Last 3 Encounters:  03/15/21 164 lb 4 oz (74.5 kg)  02/06/21 158 lb (71.7 kg)  02/24/20 163 lb (73.9 kg)    Physical Exam Vitals and nursing note reviewed.  Constitutional:      General: He is not in acute distress.    Appearance: He is well-developed. He is not diaphoretic.  Eyes:     General: No scleral icterus.    Conjunctiva/sclera: Conjunctivae normal.  Neck:     Thyroid: No thyromegaly.  Cardiovascular:     Rate and Rhythm: Normal rate and regular rhythm.     Heart sounds: Normal heart sounds. No murmur heard. Pulmonary:     Effort: Pulmonary effort is normal. No respiratory distress.     Breath sounds: Normal breath sounds. No wheezing.  Abdominal:     General: Abdomen is flat. Bowel sounds are normal. There is no distension.     Palpations: Abdomen is soft. There is no mass.     Tenderness: There is no abdominal tenderness. There is no right CVA tenderness, left CVA tenderness, guarding or rebound. Negative signs include Murphy's sign.  Musculoskeletal:        General: No swelling.     Cervical back: Neck supple.  Lymphadenopathy:     Cervical: No cervical adenopathy.  Skin:    General: Skin is warm and dry.     Findings: No rash.  Neurological:     Mental Status: He is alert and oriented to person, place, and time.     Coordination: Coordination normal.  Psychiatric:        Behavior: Behavior normal.    Results for orders placed or performed in visit on 02/17/20  CBC with Differential/Platelet  Result Value Ref Range   WBC 6.1 3.4 - 10.8 x10E3/uL   RBC 5.25 4.14 - 5.80 x10E6/uL   Hemoglobin 14.6 13.0 - 17.7 g/dL   Hematocrit 43.9 37.5 - 51.0 %   MCV 84 79 - 97 fL   MCH 27.8 26.6 - 33.0 pg   MCHC 33.3 31.5 - 35.7 g/dL   RDW 13.4 11.6 - 15.4 %    Platelets 294 150 - 450 x10E3/uL   Neutrophils 49 Not Estab. %   Lymphs 32 Not Estab. %   Monocytes 11 Not Estab. %   Eos 6 Not Estab. %   Basos 2 Not Estab. %  Neutrophils Absolute 3.0 1.4 - 7.0 x10E3/uL   Lymphocytes Absolute 2.0 0.7 - 3.1 x10E3/uL   Monocytes Absolute 0.7 0.1 - 0.9 x10E3/uL   EOS (ABSOLUTE) 0.3 0.0 - 0.4 x10E3/uL   Basophils Absolute 0.1 0.0 - 0.2 x10E3/uL   Immature Granulocytes 0 Not Estab. %   Immature Grans (Abs) 0.0 0.0 - 0.1 x10E3/uL  CMP14+EGFR  Result Value Ref Range   Glucose 78 65 - 99 mg/dL   BUN 21 8 - 27 mg/dL   Creatinine, Ser 0.94 0.76 - 1.27 mg/dL   GFR calc non Af Amer 81 >59 mL/min/1.73   GFR calc Af Amer 94 >59 mL/min/1.73   BUN/Creatinine Ratio 22 10 - 24   Sodium 139 134 - 144 mmol/L   Potassium 4.4 3.5 - 5.2 mmol/L   Chloride 101 96 - 106 mmol/L   CO2 23 20 - 29 mmol/L   Calcium 9.6 8.6 - 10.2 mg/dL   Total Protein 6.8 6.0 - 8.5 g/dL   Albumin 4.5 3.7 - 4.7 g/dL   Globulin, Total 2.3 1.5 - 4.5 g/dL   Albumin/Globulin Ratio 2.0 1.2 - 2.2   Bilirubin Total 0.2 0.0 - 1.2 mg/dL   Alkaline Phosphatase 50 48 - 121 IU/L   AST 22 0 - 40 IU/L   ALT 15 0 - 44 IU/L  Lipid panel  Result Value Ref Range   Cholesterol, Total 194 100 - 199 mg/dL   Triglycerides 131 0 - 149 mg/dL   HDL 30 (L) >39 mg/dL   VLDL Cholesterol Cal 24 5 - 40 mg/dL   LDL Chol Calc (NIH) 140 (H) 0 - 99 mg/dL   Chol/HDL Ratio 6.5 (H) 0.0 - 5.0 ratio  PSA, total and free  Result Value Ref Range   Prostate Specific Ag, Serum 1.8 0.0 - 4.0 ng/mL   PSA, Free 0.46 N/A ng/mL   PSA, Free Pct 25.6 %    Assessment & Plan:   Problem List Items Addressed This Visit       Digestive   GERD (gastroesophageal reflux disease)   Relevant Medications   omeprazole (PRILOSEC) 40 MG capsule     Other   Hyperlipidemia - Primary   Relevant Medications   fenofibrate 160 MG tablet   Family history of prostate cancer   Relevant Orders   PSA, total and free   Other Visit  Diagnoses     Physical exam       Relevant Orders   CBC with Differential/Platelet   CMP14+EGFR   Lipid panel   Fever, unknown origin       Relevant Orders   Rocky mtn spotted fvr abs pnl(IgG+IgM)   Lyme Disease Serology w/Reflex       Will test rocky spotted fever and Lyme disease and do blood work. Follow up plan: Return in about 1 year (around 03/15/2022), or if symptoms worsen or fail to improve, for Hyperlipidemia and physical.  Counseling provided for all of the vaccine components Orders Placed This Encounter  Procedures   CBC with Differential/Platelet   CMP14+EGFR   Lipid panel   PSA, total and free   Rocky mtn spotted fvr abs pnl(IgG+IgM)   Lyme Disease Serology w/Reflex    Caryl Pina, MD [facility] 03/15/2021, 10:32 AM

## 2021-03-22 LAB — RMSF, IGG, IFA: RMSF, IGG, IFA: <1:64 {titer}

## 2021-03-22 LAB — CBC WITH DIFFERENTIAL/PLATELET
Basophils Absolute: 0 10*3/uL (ref 0.0–0.2)
Basos: 1 %
EOS (ABSOLUTE): 0 10*3/uL (ref 0.0–0.4)
Eos: 0 %
Hematocrit: 44.3 % (ref 37.5–51.0)
Hemoglobin: 14.9 g/dL (ref 13.0–17.7)
Immature Grans (Abs): 0 10*3/uL (ref 0.0–0.1)
Immature Granulocytes: 0 %
Lymphocytes Absolute: 0.5 10*3/uL — ABNORMAL LOW (ref 0.7–3.1)
Lymphs: 10 %
MCH: 28.1 pg (ref 26.6–33.0)
MCHC: 33.6 g/dL (ref 31.5–35.7)
MCV: 84 fL (ref 79–97)
Monocytes Absolute: 0.2 10*3/uL (ref 0.1–0.9)
Monocytes: 5 %
Neutrophils Absolute: 4.2 10*3/uL (ref 1.4–7.0)
Neutrophils: 84 %
Platelets: 213 10*3/uL (ref 150–450)
RBC: 5.3 x10E6/uL (ref 4.14–5.80)
RDW: 13.3 % (ref 11.6–15.4)
WBC: 5 10*3/uL (ref 3.4–10.8)

## 2021-03-22 LAB — CMP14+EGFR
ALT: 62 IU/L — ABNORMAL HIGH (ref 0–44)
AST: 70 IU/L — ABNORMAL HIGH (ref 0–40)
Albumin/Globulin Ratio: 1.6 (ref 1.2–2.2)
Albumin: 4.2 g/dL (ref 3.7–4.7)
Alkaline Phosphatase: 52 IU/L (ref 44–121)
BUN/Creatinine Ratio: 14 (ref 10–24)
BUN: 15 mg/dL (ref 8–27)
Bilirubin Total: 0.4 mg/dL (ref 0.0–1.2)
CO2: 23 mmol/L (ref 20–29)
Calcium: 9.7 mg/dL (ref 8.6–10.2)
Chloride: 99 mmol/L (ref 96–106)
Creatinine, Ser: 1.11 mg/dL (ref 0.76–1.27)
Globulin, Total: 2.6 g/dL (ref 1.5–4.5)
Glucose: 137 mg/dL — ABNORMAL HIGH (ref 65–99)
Potassium: 4.2 mmol/L (ref 3.5–5.2)
Sodium: 136 mmol/L (ref 134–144)
Total Protein: 6.8 g/dL (ref 6.0–8.5)
eGFR: 71 mL/min/{1.73_m2} (ref >59)

## 2021-03-22 LAB — ROCKY MTN SPOTTED FVR ABS PNL(IGG+IGM)
RMSF IgG: POSITIVE — AB
RMSF IgM: 0.82 index (ref 0.00–0.89)

## 2021-03-22 LAB — PSA, TOTAL AND FREE
PSA, Free Pct: 23.3 %
PSA, Free: 0.42 ng/mL
Prostate Specific Ag, Serum: 1.8 ng/mL (ref 0.0–4.0)

## 2021-03-22 LAB — LYME DISEASE SEROLOGY W/REFLEX: Lyme Total Antibody EIA: NEGATIVE

## 2021-03-22 LAB — LIPID PANEL
Chol/HDL Ratio: 5.8 ratio — ABNORMAL HIGH (ref 0.0–5.0)
Cholesterol, Total: 168 mg/dL (ref 100–199)
HDL: 29 mg/dL — ABNORMAL LOW (ref >39)
LDL Chol Calc (NIH): 120 mg/dL — ABNORMAL HIGH (ref 0–99)
Triglycerides: 105 mg/dL (ref 0–149)
VLDL Cholesterol Cal: 19 mg/dL (ref 5–40)

## 2021-03-26 ENCOUNTER — Other Ambulatory Visit: Payer: Self-pay | Admitting: Family Medicine

## 2021-03-26 DIAGNOSIS — A770 Spotted fever due to Rickettsia rickettsii: Secondary | ICD-10-CM

## 2021-03-26 MED ORDER — DOXYCYCLINE HYCLATE 100 MG PO TABS: 100.0000 mg | ORAL_TABLET | Freq: Two times a day (BID) | ORAL | 0 refills | Status: DC

## 2021-03-26 NOTE — Progress Notes (Unsigned)
Patient's David Randall spotted fever came back positive, sent doxycycline for him

## 2021-04-04 ENCOUNTER — Other Ambulatory Visit: Payer: Self-pay | Admitting: Family Medicine

## 2021-05-15 ENCOUNTER — Other Ambulatory Visit: Payer: Self-pay | Admitting: Family Medicine

## 2021-07-30 ENCOUNTER — Encounter: Payer: Self-pay | Admitting: Family Medicine

## 2021-07-30 ENCOUNTER — Ambulatory Visit (INDEPENDENT_AMBULATORY_CARE_PROVIDER_SITE_OTHER): Payer: PPO | Admitting: Family Medicine

## 2021-07-30 VITALS — BP 117/66 | HR 84 | Temp 98.1°F | Ht 67.0 in | Wt 169.1 lb

## 2021-07-30 DIAGNOSIS — J101 Influenza due to other identified influenza virus with other respiratory manifestations: Secondary | ICD-10-CM | POA: Diagnosis not present

## 2021-07-30 DIAGNOSIS — Z20828 Contact with and (suspected) exposure to other viral communicable diseases: Secondary | ICD-10-CM | POA: Diagnosis not present

## 2021-07-30 DIAGNOSIS — R509 Fever, unspecified: Secondary | ICD-10-CM | POA: Diagnosis not present

## 2021-07-30 DIAGNOSIS — R52 Pain, unspecified: Secondary | ICD-10-CM | POA: Diagnosis not present

## 2021-07-30 LAB — VERITOR FLU A/B WAIVED
Influenza A: POSITIVE — AB
Influenza B: NEGATIVE

## 2021-07-30 MED ORDER — BENZONATATE 100 MG PO CAPS: 100.0000 mg | ORAL_CAPSULE | Freq: Three times a day (TID) | ORAL | 0 refills | Status: DC | PRN

## 2021-07-30 MED ORDER — OSELTAMIVIR PHOSPHATE 75 MG PO CAPS: 75.0000 mg | ORAL_CAPSULE | Freq: Two times a day (BID) | ORAL | 0 refills | Status: AC

## 2021-07-30 NOTE — Patient Instructions (Signed)
Influenza, Adult °Influenza, also called "the flu," is a viral infection that mainly affects the respiratory tract. This includes the lungs, nose, and throat. The flu spreads easily from person to person (is contagious). It causes common cold symptoms, along with high fever and body aches. °What are the causes? °This condition is caused by the influenza virus. You can get the virus by: °Breathing in droplets that are in the air from an infected person's cough or sneeze. °Touching something that has the virus on it (has been contaminated) and then touching your mouth, nose, or eyes. °What increases the risk? °The following factors may make you more likely to get the flu: °Not washing or sanitizing your hands often. °Having close contact with many people during cold and flu season. °Touching your mouth, eyes, or nose without first washing or sanitizing your hands. °Not getting an annual flu shot. °You may have a higher risk for the flu, including serious problems, such as a lung infection (pneumonia), if you: °Are older than 65. °Are pregnant. °Have a weakened disease-fighting system (immune system). This includes people who have HIV or AIDS, are on chemotherapy, or are taking medicines that reduce (suppress) the immune system. °Have a long-term (chronic) illness, such as heart disease, kidney disease, diabetes, or lung disease. °Have a liver disorder. °Are severely overweight (morbidly obese). °Have anemia. °Have asthma. °What are the signs or symptoms? °Symptoms of this condition usually begin suddenly and last 4-14 days. These may include: °Fever and chills. °Headaches, body aches, or muscle aches. °Sore throat. °Cough. °Runny or stuffy (congested) nose. °Chest discomfort. °Poor appetite. °Weakness or fatigue. °Dizziness. °Nausea or vomiting. °How is this diagnosed? °This condition may be diagnosed based on: °Your symptoms and medical history. °A physical exam. °Swabbing your nose or throat and testing the fluid  for the influenza virus. °How is this treated? °If the flu is diagnosed early, you can be treated with antiviral medicine that is given by mouth (orally) or through an IV. This can help reduce how severe the illness is and how long it lasts. °Taking care of yourself at home can help relieve symptoms. Your health care provider may recommend: °Taking over-the-counter medicines. °Drinking plenty of fluids. °In many cases, the flu goes away on its own. If you have severe symptoms or complications, you may be treated in a hospital. °Follow these instructions at home: °Activity °Rest as needed and get plenty of sleep. °Stay home from work or school as told by your health care provider. Unless you are visiting your health care provider, avoid leaving home until your fever has been gone for 24 hours without taking medicine. °Eating and drinking °Take an oral rehydration solution (ORS). This is a drink that is sold at pharmacies and retail stores. °Drink enough fluid to keep your urine pale yellow. °Drink clear fluids in small amounts as you are able. Clear fluids include water, ice chips, fruit juice mixed with water, and low-calorie sports drinks. °Eat bland, easy-to-digest foods in small amounts as you are able. These foods include bananas, applesauce, rice, lean meats, toast, and crackers. °Avoid drinking fluids that contain a lot of sugar or caffeine, such as energy drinks, regular sports drinks, and soda. °Avoid alcohol. °Avoid spicy or fatty foods. °General instructions °  °Take over-the-counter and prescription medicines only as told by your health care provider. °Use a cool mist humidifier to add humidity to the air in your home. This can make it easier to breathe. °When using a cool mist humidifier,   clean it daily. Empty the water and replace it with clean water. °Cover your mouth and nose when you cough or sneeze. °Wash your hands with soap and water often and for at least 20 seconds, especially after you cough or  sneeze. If soap and water are not available, use alcohol-based hand sanitizer. °Keep all follow-up visits. This is important. °How is this prevented? ° °Get an annual flu shot. This is usually available in late summer, fall, or winter. Ask your health care provider when you should get your flu shot. °Avoid contact with people who are sick during cold and flu season. This is generally fall and winter. °Contact a health care provider if: °You develop new symptoms. °You have: °Chest pain. °Diarrhea. °A fever. °Your cough gets worse. °You produce more mucus. °You feel nauseous or you vomit. °Get help right away if you: °Develop shortness of breath or have difficulty breathing. °Have skin or nails that turn a bluish color. °Have severe pain or stiffness in your neck. °Develop a sudden headache or sudden pain in your face or ear. °Cannot eat or drink without vomiting. °These symptoms may represent a serious problem that is an emergency. Do not wait to see if the symptoms will go away. Get medical help right away. Call your local emergency services (911 in the [place] Do not drive yourself to the hospital. °Summary °Influenza, also called "the flu," is a viral infection that primarily affects your respiratory tract. °Symptoms of the flu usually begin suddenly and last 4-14 days. °Getting an annual flu shot is the best way to prevent getting the flu. °Stay home from work or school as told by your health care provider. Unless you are visiting your health care provider, avoid leaving home until your fever has been gone for 24 hours without taking medicine. °Keep all follow-up visits. This is important. °This information is not intended to replace advice given to you by your health care provider. Make sure you discuss any questions you have with your health care provider. °Document Revised: 03/17/2020 Document Reviewed: 03/17/2020 °Elsevier Patient Education © 2022 [employer] Inc[employer] ° °

## 2021-07-30 NOTE — Progress Notes (Signed)
Acute Office Visit  Subjective:    Patient ID: David Randall, male    DOB: [birth year], 75 y.o.   MRN: [id number]  Chief Complaint  Patient presents with   Fever    HPI Patient is in today for fever x 1 day. Last night his temperature reached 101.7. He also reports chills, body aches, headache, cough, and congestion. He denies chest pain or shortness of breath. He has taken tylenol and ibuprofen for his symptoms. He has a family member who tested positive for flu A a few days ago. He reports that it is often hard for him to clear a cough after a viral illness. He has used Chereatussin AC in the past with good results.   Past Medical History:  Diagnosis Date   Allergy    Bradycardia    GERD (gastroesophageal reflux disease)    Hemorrhoids    Hyperlipidemia    Inguinal hernia     Past Surgical History:  Procedure Laterality Date   COLONOSCOPY N/A 02/26/2016   Procedure: COLONOSCOPY;  Surgeon: Danie Binder, MD;  Location: [facility];  Service: Endoscopy;  Laterality: N/A;  8:30 Am   HERNIA REPAIR     TONSILLECTOMY  1969    Family History  Problem Relation Age of Onset   Cancer Mother        breast, bone   Heart disease Father    Prostate cancer Brother    Cancer Brother        prostate   Cancer Sister        breast    Social History   Socioeconomic History   Marital status: Married    Spouse name: Not on file   Number of children: 1   Years of education: Not on file   Highest education level: Not on file  Occupational History   Not on file  Tobacco Use   Smoking status: Former    Types: Pipe    Quit date: 08/12/1984    Years since quitting: 36.9   Smokeless tobacco: Never  Vaping Use   Vaping Use: Never used  Substance and Sexual Activity   Alcohol use: No   Drug use: No   Sexual activity: Yes  Other Topics Concern   Not on file  Social History Narrative   Lives home with wife - foster children   Son lives in [place]   Retired, but farms on the  side   Social Determinants of Health   Financial Resource Strain: Low Risk    Difficulty of Paying Living Expenses: Not hard at all  Food Insecurity: No Food Insecurity   Worried About Charity fundraiser in the Last Year: Never true   Arboriculturist in the Last Year: Never true  Transportation Needs: No Transportation Needs   Lack of Transportation (Medical): No   Lack of Transportation (Non-Medical): No  Physical Activity: Sufficiently Active   Days of Exercise per Week: 7 days   Minutes of Exercise per Session: 60 min  Stress: No Stress Concern Present   Feeling of Stress : Not at all  Social Connections: Moderately Integrated   Frequency of Communication with Friends and Family: More than three times a week   Frequency of Social Gatherings with Friends and Family: More than three times a week   Attends Religious Services: Never   Marine scientist or Organizations: Yes   Attends Archivist Meetings: 1 to 4 times per year  Marital Status: Married  Human resources officer Violence: Not At Risk   Fear of Current or Ex-Partner: No   Emotionally Abused: No   Physically Abused: No   Sexually Abused: No    Outpatient Medications Prior to Visit  Medication Sig Dispense Refill   b complex vitamins capsule Take 1 capsule by mouth daily.     clobetasol cream (TEMOVATE) 6.28 % Apply 1 application topically 2 (two) times daily as needed. 30 g 3   fenofibrate 160 MG tablet Take 1 tablet (160 mg total) by mouth daily. 90 tablet 3   fluticasone (FLONASE) 50 MCG/ACT nasal spray USE 2 SPRAYS IN EACH NOSTRIL ONCE DAILY 48 g 1   Glucosamine-Chondroitin 1500-1200 MG/30ML LIQD Take by mouth.       loratadine (CLARITIN) 10 MG tablet Take 10 mg by mouth daily.     LYCOPENE PO Take by mouth.     magnesium citrate SOLN Take 1 Bottle by mouth once.     Multiple Vitamin (MULTIVITAMIN) capsule Take 1 capsule by mouth daily.       Omega-3 Fatty Acids (FISH OIL) 1000 MG CAPS Take by mouth.  Takes 2 tablets daily     omeprazole (PRILOSEC) 40 MG capsule Take 1 capsule (40 mg total) by mouth daily. 90 capsule 3   polycarbophil (FIBERCON) 625 MG tablet Take 625 mg by mouth daily.       Probiotic Product (PROBIOTIC PO) Take by mouth.       doxycycline (VIBRA-TABS) 100 MG tablet Take 1 tablet (100 mg total) by mouth 2 (two) times daily. 1 po bid 28 tablet 0   No facility-administered medications prior to visit.    Allergies  Allergen Reactions   Statins Other (See Comments)    Myalgias, fatigue, depression   Niacin And Related Rash    Rash and itching    Review of Systems As per HPI.     Objective:    Physical Exam Vitals and nursing note reviewed.  Constitutional:      General: He is not in acute distress.    Appearance: He is not ill-appearing, toxic-appearing or diaphoretic.  HENT:     Right Ear: Tympanic membrane, ear canal and external ear normal.     Left Ear: Tympanic membrane, ear canal and external ear normal.     Nose: Congestion present.     Mouth/Throat:     Mouth: Mucous membranes are moist.     Pharynx: Posterior oropharyngeal erythema present. No oropharyngeal exudate.  Eyes:     Extraocular Movements: Extraocular movements intact.     Pupils: Pupils are equal, round, and reactive to light.  Cardiovascular:     Rate and Rhythm: Normal rate and regular rhythm.     Heart sounds: Normal heart sounds. No murmur heard. Pulmonary:     Effort: Pulmonary effort is normal. No respiratory distress.     Breath sounds: Normal breath sounds. No stridor. No wheezing, rhonchi or rales.  Chest:     Chest wall: No tenderness.  Abdominal:     General: There is no distension.     Palpations: Abdomen is soft.     Tenderness: There is no abdominal tenderness.  Musculoskeletal:     Right lower leg: No edema.     Left lower leg: No edema.  Skin:    General: Skin is warm and dry.  Neurological:     Mental Status: He is alert and oriented to person, place, and  time.  Psychiatric:  Behavior: Behavior normal.    BP 117/66    Pulse 84    Temp 98.1 F (36.7 C) (Temporal)    Ht _0  (1.702 m)    Wt 169 lb 2 oz (76.7 kg)    BMI 26.49 kg/m  Wt Readings from Last 3 Encounters:  07/30/21 169 lb 2 oz (76.7 kg)  03/15/21 164 lb 4 oz (74.5 kg)  02/06/21 158 lb (71.7 kg)    Health Maintenance Due  Topic Date Due   COVID-19 Vaccine (4 - Booster for Moderna series) 08/15/2020    There are no preventive care reminders to display for this patient.   No results found for: TSH Lab Results  Component Value Date   WBC 5.0 03/15/2021   HGB 14.9 03/15/2021   HCT 44.3 03/15/2021   MCV 84 03/15/2021   PLT 213 03/15/2021   Lab Results  Component Value Date   NA 136 03/15/2021   K 4.2 03/15/2021   CO2 23 03/15/2021   GLUCOSE 137 (H) 03/15/2021   BUN 15 03/15/2021   CREATININE 1.11 03/15/2021   BILITOT 0.4 03/15/2021   ALKPHOS 52 03/15/2021   AST 70 (H) 03/15/2021   ALT 62 (H) 03/15/2021   PROT 6.8 03/15/2021   ALBUMIN 4.2 03/15/2021   CALCIUM 9.7 03/15/2021   EGFR 71 03/15/2021   Lab Results  Component Value Date   CHOL 168 03/15/2021   Lab Results  Component Value Date   HDL 29 (L) 03/15/2021   Lab Results  Component Value Date   LDLCALC 120 (H) 03/15/2021   Lab Results  Component Value Date   TRIG 105 03/15/2021   Lab Results  Component Value Date   CHOLHDL 5.8 (H) 03/15/2021   No results found for: HGBA1C     Assessment & Plan:   Javian was seen today for fever.  Diagnoses and all orders for this visit:  Influenza A Flu A positive today. Tamiflu discussed and ordered. Will try tessalon perles first, if no improvement will try Cheratussin AC. Discussed symptomatic care and return precautions.  -     Veritor Flu A/B Waived -     oseltamivir (TAMIFLU) 75 MG capsule; Take 1 capsule (75 mg total) by mouth 2 (two) times daily for 5 days. -     benzonatate (TESSALON PERLES) 100 MG capsule; Take 1 capsule (100 mg  total) by mouth 3 (three) times daily as needed for cough.  Return to office for new or worsening symptoms, or if symptoms persist.   The patient indicates understanding of these issues and agrees with the plan.  Gwenlyn Perking, FNP

## 2021-08-07 ENCOUNTER — Other Ambulatory Visit: Payer: Self-pay | Admitting: Family Medicine

## 2021-08-07 ENCOUNTER — Telehealth: Payer: Self-pay | Admitting: Family Medicine

## 2021-08-07 DIAGNOSIS — R059 Cough, unspecified: Secondary | ICD-10-CM

## 2021-08-07 DIAGNOSIS — J101 Influenza due to other identified influenza virus with other respiratory manifestations: Secondary | ICD-10-CM

## 2021-08-07 MED ORDER — CHERATUSSIN AC 100-10 MG/5ML PO SOLN: 5.0000 mL | Freq: Three times a day (TID) | ORAL | 0 refills | Status: DC | PRN

## 2021-08-07 NOTE — Telephone Encounter (Signed)
Pt called back stating that he has already tried Delsym OTC and it doesn't do anything for him. Wants to know if Cherratussin [facility] can be called in for him like him and Marjorie Smolder talked about at last visit.   Please advise and call patient.

## 2021-08-07 NOTE — Telephone Encounter (Signed)
Per last OV notes with Tiffany she did put if the [employer] didn't work she would try Cherratussin AC. Please advise?

## 2021-08-07 NOTE — Telephone Encounter (Signed)
Pt aware of provider feedback and voiced understanding. 

## 2021-08-07 NOTE — Telephone Encounter (Signed)
Contacted patient. Patient aware.  Patient going to pharmacy to get delsym

## 2021-08-13 ENCOUNTER — Encounter: Payer: Self-pay | Admitting: Physician Assistant

## 2021-08-13 ENCOUNTER — Telehealth: Payer: PPO | Admitting: Physician Assistant

## 2021-08-13 ENCOUNTER — Telehealth: Payer: PPO | Admitting: Emergency Medicine

## 2021-08-13 DIAGNOSIS — J208 Acute bronchitis due to other specified organisms: Secondary | ICD-10-CM | POA: Diagnosis not present

## 2021-08-13 DIAGNOSIS — B9689 Other specified bacterial agents as the cause of diseases classified elsewhere: Secondary | ICD-10-CM

## 2021-08-13 MED ORDER — DOXYCYCLINE HYCLATE 100 MG PO TABS: 100.0000 mg | ORAL_TABLET | Freq: Two times a day (BID) | ORAL | 0 refills | Status: DC

## 2021-08-13 MED ORDER — ALBUTEROL SULFATE HFA 108 (90 BASE) MCG/ACT IN AERS
2.0000 | INHALATION_SPRAY | Freq: Four times a day (QID) | RESPIRATORY_TRACT | 0 refills | Status: DC | PRN
Start: 2021-08-13 — End: 2022-04-29

## 2021-08-13 MED ORDER — PREDNISONE 20 MG PO TABS: 20.0000 mg | ORAL_TABLET | Freq: Every day | ORAL | 0 refills | Status: DC

## 2021-08-13 NOTE — Patient Instructions (Signed)
David Randall, thank you for joining Leeanne Rio, PA-C for today's virtual visit.  While this provider is not your primary care provider (PCP), if your PCP is located in our provider database this encounter information will be shared with them immediately following your visit.  Consent: (Patient) David Randall provided verbal consent for this virtual visit at the beginning of the encounter.  Current Medications:  Current Outpatient Medications:    albuterol (VENTOLIN HFA) 108 (90 Base) MCG/ACT inhaler, Inhale 2 puffs into the lungs every 6 (six) hours as needed for wheezing or shortness of breath., Disp: 8 g, Rfl: 0   doxycycline (VIBRA-TABS) 100 MG tablet, Take 1 tablet (100 mg total) by mouth 2 (two) times daily., Disp: 14 tablet, Rfl: 0   predniSONE (DELTASONE) 20 MG tablet, Take 1 tablet (20 mg total) by mouth daily with breakfast., Disp: 6 tablet, Rfl: 0   b complex vitamins capsule, Take 1 capsule by mouth daily., Disp: , Rfl:    clobetasol cream (TEMOVATE) 6.78 %, Apply 1 application topically 2 (two) times daily as needed., Disp: 30 g, Rfl: 3   fenofibrate 160 MG tablet, Take 1 tablet (160 mg total) by mouth daily., Disp: 90 tablet, Rfl: 3   fluticasone (FLONASE) 50 MCG/ACT nasal spray, USE 2 SPRAYS IN EACH NOSTRIL ONCE DAILY, Disp: 48 g, Rfl: 1   Glucosamine-Chondroitin 1500-1200 MG/30ML LIQD, Take by mouth.  , Disp: , Rfl:    guaiFENesin-codeine (CHERATUSSIN AC) 100-10 MG/5ML syrup, Take 5 mLs by mouth 3 (three) times daily as needed for cough., Disp: 120 mL, Rfl: 0   loratadine (CLARITIN) 10 MG tablet, Take 10 mg by mouth daily., Disp: , Rfl:    LYCOPENE PO, Take by mouth., Disp: , Rfl:    magnesium citrate SOLN, Take 1 Bottle by mouth once., Disp: , Rfl:    Multiple Vitamin (MULTIVITAMIN) capsule, Take 1 capsule by mouth daily.  , Disp: , Rfl:    Omega-3 Fatty Acids (FISH OIL) 1000 MG CAPS, Take by mouth. Takes 2 tablets daily, Disp: , Rfl:    omeprazole (PRILOSEC) 40 MG  capsule, Take 1 capsule (40 mg total) by mouth daily., Disp: 90 capsule, Rfl: 3   polycarbophil (FIBERCON) 625 MG tablet, Take 625 mg by mouth daily.  , Disp: , Rfl:    Probiotic Product (PROBIOTIC PO), Take by mouth.  , Disp: , Rfl:    Medications ordered in this encounter:  Meds ordered this encounter  Medications   doxycycline (VIBRA-TABS) 100 MG tablet    Sig: Take 1 tablet (100 mg total) by mouth 2 (two) times daily.    Dispense:  14 tablet    Refill:  0    Order Specific Question:   Supervising Provider    Answer:   MILLER, BRIAN [[id number]]   predniSONE (DELTASONE) 20 MG tablet    Sig: Take 1 tablet (20 mg total) by mouth daily with breakfast.    Dispense:  6 tablet    Refill:  0    Order Specific Question:   Supervising Provider    Answer:   MILLER, BRIAN [[id number]]   albuterol (VENTOLIN HFA) 108 (90 Base) MCG/ACT inhaler    Sig: Inhale 2 puffs into the lungs every 6 (six) hours as needed for wheezing or shortness of breath.    Dispense:  8 g    Refill:  0    Order Specific Question:   Supervising Provider    Answer:   Noemi Chapel [[id number]]     *  If you need refills on other medications prior to your next appointment, please contact your pharmacy*  Follow-Up: Call back or seek an in-person evaluation if the symptoms worsen or if the condition fails to improve as anticipated.  Other Instructions Take antibiotic (Doxycycline) as directed.  Increase fluids.  Get plenty of rest. Use Mucinex for congestion. Continue cough medications as directed. Take a daily probiotic (I recommend Align or Culturelle, but even Activia Yogurt may be beneficial).  A humidifier placed in the bedroom may offer some relief for a dry, scratchy throat of nasal irritation.  Read information below on acute bronchitis. Please call or return to clinic if symptoms are not improving.  Acute Bronchitis Bronchitis is when the airways that extend from the windpipe into the lungs get red, puffy, and painful (inflamed).  Bronchitis often causes thick spit (mucus) to develop. This leads to a cough. A cough is the most common symptom of bronchitis. In acute bronchitis, the condition usually begins suddenly and goes away over time (usually in 2 weeks). Smoking, allergies, and asthma can make bronchitis worse. Repeated episodes of bronchitis may cause more lung problems.  HOME CARE Rest. Drink enough fluids to keep your pee (urine) clear or pale yellow (unless you need to limit fluids as told by your doctor). Only take over-the-counter or prescription medicines as told by your doctor. Avoid smoking and secondhand smoke. These can make bronchitis worse. If you are a smoker, think about using nicotine gum or skin patches. Quitting smoking will help your lungs heal faster. Reduce the chance of getting bronchitis again by: Washing your hands often. Avoiding people with cold symptoms. Trying not to touch your hands to your mouth, nose, or eyes. Follow up with your doctor as told.  GET HELP IF: Your symptoms do not improve after 1 week of treatment. Symptoms include: Cough. Fever. Coughing up thick spit. Body aches. Chest congestion. Chills. Shortness of breath. Sore throat.  GET HELP RIGHT AWAY IF:  You have an increased fever. You have chills. You have severe shortness of breath. You have bloody thick spit (sputum). You throw up (vomit) often. You lose too much body fluid (dehydration). You have a severe headache. You faint.  MAKE SURE YOU:  Understand these instructions. Will watch your condition. Will get help right away if you are not doing well or get worse. Document Released: 01/15/2008 Document Revised: 03/31/2013 Document Reviewed: 01/19/2013 [facility] Patient Information 2015 [place], [place]. This information is not intended to replace advice given to you by your health care provider. Make sure you discuss any questions you have with your health care provider.    If you have been instructed  to have an in-person evaluation today at a local Urgent Care facility, please use the link below. It will take you to a list of all of our available [facility], including address, phone number and hours of operation. Please do not delay care.  [facility]  If you or a family member do not have a primary care provider, use the link below to schedule a visit and establish care. When you choose a [facility] primary care physician or advanced practice provider, you gain a long-term partner in health. Find a Primary Care Provider  Learn more about 's in-office and virtual care options: [facility]

## 2021-08-13 NOTE — Progress Notes (Signed)
Patient submitted E-visit questionnaire for flulike symptoms however symptoms reported do not appear consistent with influenza plus patient reports shortness of breath.  I advised patient to seek a virtual urgent care visit instead.

## 2021-08-13 NOTE — Progress Notes (Signed)
Virtual Visit Consent   David Randall, you are scheduled for a virtual visit with a [facility] provider today.     Just as with appointments in the office, your consent must be obtained to participate.  Your consent will be active for this visit and any virtual visit you may have with one of our providers in the next 365 days.     If you have a MyChart account, a copy of this consent can be sent to you electronically.  All virtual visits are billed to your insurance company just like a traditional visit in the office.    As this is a virtual visit, video technology does not allow for your provider to perform a traditional examination.  This may limit your provider's ability to fully assess your condition.  If your provider identifies any concerns that need to be evaluated in person or the need to arrange testing (such as labs, EKG, etc.), we will make arrangements to do so.     Although advances in technology are sophisticated, we cannot ensure that it will always work on either your end or our end.  If the connection with a video visit is poor, the visit may have to be switched to a telephone visit.  With either a video or telephone visit, we are not always able to ensure that we have a secure connection.     I need to obtain your verbal consent now.   Are you willing to proceed with your visit today?    David Randall has provided verbal consent on 08/13/2021 for a virtual visit (video or telephone).   Leeanne Rio, [place]   Date: 08/13/2021 1:49 PM   Virtual Visit via Video Note   I, Leeanne Rio, PA-C, attempted to connect with David Randall; MRN [id number] on 08/13/21 via [place] to complete a video urgent care visit. The patient was unable to successfully connect to the video platform. As such, the patient was contacted by this provider via phone to complete the encounter.   Location: Patient: Virtual Visit Location Patient: Home Provider: Virtual Visit Location Provider:  Home Office   I discussed the limitations of evaluation and management by telemedicine and the availability of in person appointments. The patient expressed understanding and agreed to proceed.    History of Present Illness: David Randall is a 76 y.o. who identifies as a male who was assigned male at birth, and is being seen today for ongoing URI symptoms. Was initially evaluated around christmas and treated for influenza A with Tamiflu and supportive measures without any noted improvement. Now with worsening chest congestion and cough that has become productive. Notes coughing spells are significant but are helped with the cough medication previously given. Notes he always ends up with bronchitis and ongoing cough when he gets sick.    HPI: HPI  Problems:  Patient Active Problem List   Diagnosis Date Noted   GERD (gastroesophageal reflux disease) 04/29/2017   Family history of prostate cancer 04/29/2017   Hyperlipidemia 03/25/2008   ALLERGIC RHINITIS 03/25/2008    Allergies:  Allergies  Allergen Reactions   Statins Other (See Comments)    Myalgias, fatigue, depression   Niacin And Related Rash    Rash and itching   Medications:  Current Outpatient Medications:    doxycycline (VIBRA-TABS) 100 MG tablet, Take 1 tablet (100 mg total) by mouth 2 (two) times daily., Disp: 14 tablet, Rfl: 0   predniSONE (DELTASONE) 20 MG tablet, Take 1 tablet (20  mg total) by mouth daily with breakfast., Disp: 6 tablet, Rfl: 0   b complex vitamins capsule, Take 1 capsule by mouth daily., Disp: , Rfl:    clobetasol cream (TEMOVATE) 6.54 %, Apply 1 application topically 2 (two) times daily as needed., Disp: 30 g, Rfl: 3   fenofibrate 160 MG tablet, Take 1 tablet (160 mg total) by mouth daily., Disp: 90 tablet, Rfl: 3   fluticasone (FLONASE) 50 MCG/ACT nasal spray, USE 2 SPRAYS IN EACH NOSTRIL ONCE DAILY, Disp: 48 g, Rfl: 1   Glucosamine-Chondroitin 1500-1200 MG/30ML LIQD, Take by mouth.  , Disp: , Rfl:     guaiFENesin-codeine (CHERATUSSIN AC) 100-10 MG/5ML syrup, Take 5 mLs by mouth 3 (three) times daily as needed for cough., Disp: 120 mL, Rfl: 0   loratadine (CLARITIN) 10 MG tablet, Take 10 mg by mouth daily., Disp: , Rfl:    LYCOPENE PO, Take by mouth., Disp: , Rfl:    magnesium citrate SOLN, Take 1 Bottle by mouth once., Disp: , Rfl:    Multiple Vitamin (MULTIVITAMIN) capsule, Take 1 capsule by mouth daily.  , Disp: , Rfl:    Omega-3 Fatty Acids (FISH OIL) 1000 MG CAPS, Take by mouth. Takes 2 tablets daily, Disp: , Rfl:    omeprazole (PRILOSEC) 40 MG capsule, Take 1 capsule (40 mg total) by mouth daily., Disp: 90 capsule, Rfl: 3   polycarbophil (FIBERCON) 625 MG tablet, Take 625 mg by mouth daily.  , Disp: , Rfl:    Probiotic Product (PROBIOTIC PO), Take by mouth.  , Disp: , Rfl:   Observations/Objective: No labored breathing. Speech is clear and coherent with logical content.  Patient is alert and oriented at baseline.   Assessment and Plan: 1. Acute bacterial bronchitis - doxycycline (VIBRA-TABS) 100 MG tablet; Take 1 tablet (100 mg total) by mouth 2 (two) times daily.  Dispense: 14 tablet; Refill: 0 - predniSONE (DELTASONE) 20 MG tablet; Take 1 tablet (20 mg total) by mouth daily with breakfast.  Dispense: 6 tablet; Refill: 0  Rx Doxycycline.  Increase fluids.  Rest.  Saline nasal spray.  Probiotic.  Mucinex as directed.  Humidifier in bedroom. Continue Cheratussin. Start Prednisone daily x 3 days.  Call or return to clinic if symptoms are not improving.   Follow Up Instructions: I discussed the assessment and treatment plan with the patient. The patient was provided an opportunity to ask questions and all were answered. The patient agreed with the plan and demonstrated an understanding of the instructions.  A copy of instructions were sent to the patient via MyChart unless otherwise noted below.   The patient was advised to call back or seek an in-person evaluation if the symptoms  worsen or if the condition fails to improve as anticipated.  Time:  I spent 10 minutes with the patient via telehealth technology discussing the above problems/concerns.    Leeanne Rio, PA-C

## 2021-11-29 ENCOUNTER — Other Ambulatory Visit: Payer: Self-pay | Admitting: Family Medicine

## 2021-12-19 ENCOUNTER — Encounter: Payer: Self-pay | Admitting: Nurse Practitioner

## 2021-12-19 ENCOUNTER — Ambulatory Visit (INDEPENDENT_AMBULATORY_CARE_PROVIDER_SITE_OTHER): Payer: PPO | Admitting: Nurse Practitioner

## 2021-12-19 VITALS — BP 121/68 | HR 58 | Temp 96.9°F | Ht 66.0 in | Wt 164.0 lb

## 2021-12-19 DIAGNOSIS — J069 Acute upper respiratory infection, unspecified: Secondary | ICD-10-CM | POA: Diagnosis not present

## 2021-12-19 DIAGNOSIS — R110 Nausea: Secondary | ICD-10-CM

## 2021-12-19 MED ORDER — ONDANSETRON HCL 4 MG PO TABS: 4.0000 mg | ORAL_TABLET | Freq: Three times a day (TID) | ORAL | 0 refills | Status: DC | PRN

## 2021-12-19 MED ORDER — PREDNISONE 20 MG PO TABS: 20.0000 mg | ORAL_TABLET | Freq: Every day | ORAL | 0 refills | Status: DC

## 2021-12-19 MED ORDER — AMOXICILLIN-POT CLAVULANATE 875-125 MG PO TABS: 1.0000 | ORAL_TABLET | Freq: Two times a day (BID) | ORAL | 0 refills | Status: DC

## 2021-12-19 NOTE — Patient Instructions (Signed)

## 2021-12-19 NOTE — Progress Notes (Signed)
? ?Acute Office Visit ? ?Subjective:  ? ?  ?Patient ID: David Randall, male    DOB: Jun 21, 1946, 76 y.o.   MRN: [id number] ? ?Chief Complaint  ?Patient presents with  ? Cough  ?  Going on for 2 weeks , coughing up clear mucus   ? Emesis  ?  Pt states on 04/19 he ate breakfast and got sick the next morning he got sick , states he has gotten sick a few more times since then   ? Dizziness  ?  Since 04/20   ? ? ?Cough ?This is a new problem. The problem has been gradually worsening. The problem occurs constantly. The cough is Productive of sputum. Associated symptoms include nasal congestion, postnasal drip and shortness of breath. Pertinent negatives include no ear congestion, ear pain, fever, rash, sore throat or wheezing. Nothing aggravates the symptoms. He has tried OTC cough suppressant and a beta-agonist inhaler for the symptoms. The treatment provided no relief.  ?URI  ?This is a recurrent problem. The current episode started 1 to 4 weeks ago. The problem has been gradually worsening. There has been no fever. Associated symptoms include coughing and nausea. Pertinent negatives include no ear pain, rash, sore throat or wheezing. He has tried decongestant for the symptoms.  ? ? ?Review of Systems  ?Constitutional:  Negative for fever.  ?HENT:  Positive for postnasal drip. Negative for ear pain and sore throat.   ?Respiratory:  Positive for cough and shortness of breath. Negative for wheezing.   ?Cardiovascular: Negative.   ?Gastrointestinal:  Positive for nausea.  ?Skin:  Negative for rash.  ?All other systems reviewed and are negative. ? ? ?   ?Objective:  ?  ?BP 121/68 (BP Location: Left Arm, Patient Position: Sitting, Cuff Size: Normal)   Pulse (!) 58   Temp (!) 96.9 ?F (36.1 ?C) (Temporal)   Ht '5\' 6"'$  (1.676 m)   Wt 164 lb (74.4 kg)   SpO2 95%   BMI 26.47 kg/m?  ?BP Readings from Last 3 Encounters:  ?12/19/21 121/68  ?07/30/21 117/66  ?03/15/21 106/64  ? ?Wt Readings from Last 3 Encounters:  ?12/19/21 164 lb  (74.4 kg)  ?07/30/21 169 lb 2 oz (76.7 kg)  ?03/15/21 164 lb 4 oz (74.5 kg)  ? ?  ? ?Physical Exam ?Vitals and nursing note reviewed.  ?Constitutional:   ?   Appearance: Normal appearance.  ?HENT:  ?   Head: Normocephalic.  ?   Right Ear: External ear normal.  ?   Left Ear: External ear normal.  ?   Nose: Congestion present.  ?   Mouth/Throat:  ?   Mouth: Mucous membranes are moist.  ?Eyes:  ?   Conjunctiva/sclera: Conjunctivae normal.  ?Cardiovascular:  ?   Rate and Rhythm: Normal rate and regular rhythm.  ?   Pulses: Normal pulses.  ?   Heart sounds: Normal heart sounds.  ?Pulmonary:  ?   Effort: Pulmonary effort is normal.  ?   Breath sounds: Normal breath sounds.  ?Abdominal:  ?   General: Bowel sounds are normal.  ?Skin: ?   Findings: No rash.  ?Neurological:  ?   General: No focal deficit present.  ?   Mental Status: He is alert and oriented to person, place, and time.  ?Psychiatric:     ?   Behavior: Behavior normal.  ? ? ?No results found for any visits on 12/19/21. ? ? ?   ?Assessment & Plan:  ?Take meds as prescribed ?- Use  a cool mist humidifier  ?-Use saline nose sprays frequently ?-Force fluids ?-For fever or aches or pains- take Tylenol or ibuprofen. ?-prednisone for 6 days ?-continue Albuterol as prescribed ?-Augmentin 875-125 mg tablet by mouth  ?-If symptoms do not improve, she may need to be COVID tested to rule this out ?Follow up with worsening unresolved symptoms  ?Problem List Items Addressed This Visit   ?None ?Visit Diagnoses   ? ? Upper respiratory infection with cough and congestion    -  Primary  ? Relevant Medications  ? predniSONE (DELTASONE) 20 MG tablet  ? amoxicillin-clavulanate (AUGMENTIN) 875-125 MG tablet  ? Nausea      ? Relevant Medications  ? ondansetron (ZOFRAN) 4 MG tablet  ? ?  ? ? ?Meds ordered this encounter  ?Medications  ? predniSONE (DELTASONE) 20 MG tablet  ?  Sig: Take 1 tablet (20 mg total) by mouth daily with breakfast.  ?  Dispense:  6 tablet  ?  Refill:  0  ?   Order Specific Question:   Supervising Provider  ?  AnswerClaretta Fraise [[id number]]  ? amoxicillin-clavulanate (AUGMENTIN) 875-125 MG tablet  ?  Sig: Take 1 tablet by mouth 2 (two) times daily.  ?  Dispense:  14 tablet  ?  Refill:  0  ?  Order Specific Question:   Supervising Provider  ?  AnswerClaretta Fraise [[id number]]  ? ondansetron (ZOFRAN) 4 MG tablet  ?  Sig: Take 1 tablet (4 mg total) by mouth every 8 (eight) hours as needed for nausea or vomiting.  ?  Dispense:  20 tablet  ?  Refill:  0  ?  Order Specific Question:   Supervising Provider  ?  AnswerClaretta Fraise [[id number]]  ? ? ?Return if symptoms worsen or fail to improve. ? ?Ivy Lynn, NP ? ? ?

## 2022-02-07 ENCOUNTER — Ambulatory Visit: Payer: PPO

## 2022-02-20 ENCOUNTER — Ambulatory Visit (INDEPENDENT_AMBULATORY_CARE_PROVIDER_SITE_OTHER): Payer: PPO

## 2022-02-20 VITALS — Wt 164.0 lb

## 2022-02-20 DIAGNOSIS — Z0000 Encounter for general adult medical examination without abnormal findings: Secondary | ICD-10-CM | POA: Diagnosis not present

## 2022-02-20 NOTE — Patient Instructions (Signed)
David Randall , Thank you for taking time to come for your Medicare Wellness Visit. I appreciate your ongoing commitment to your health goals. Please review the following plan we discussed and let me know if I can assist you in the future.   Screening recommendations/referrals: Colonoscopy: Done 02/26/2016 - Repeat in 5-10 years  Recommended yearly ophthalmology/optometry visit for glaucoma screening and checkup Recommended yearly dental visit for hygiene and checkup  Vaccinations: Influenza vaccine: Done 06/05/2021 - Repeat annually Pneumococcal vaccine: Done  05/18/2013 & 05/17/2014   Tdap vaccine: Done 04/20/2018 - Repeat in 10 years  Shingles vaccine: Done  04/20/2018 & 06/22/2018    Covid-19: Done 09/01/2019, 10/02/2019, & 06/20/2020  Advanced directives: Please bring a copy of your health care power of attorney and living will to the office to be added to your chart at your convenience.   Conditions/risks identified: Keep up the great work! Aim for 30 minutes of exercise or brisk walking, 6-8 glasses of water, and 5 servings of fruits and vegetables each day.   Next appointment: Follow up in one year for your annual wellness visit.   Preventive Care 74 Years and Older, Male  Preventive care refers to lifestyle choices and visits with your health care provider that can promote health and wellness. What does preventive care include? A yearly physical exam. This is also called an annual well check. Dental exams once or twice a year. Routine eye exams. Ask your health care provider how often you should have your eyes checked. Personal lifestyle choices, including: Daily care of your teeth and gums. Regular physical activity. Eating a healthy diet. Avoiding tobacco and drug use. Limiting alcohol use. Practicing safe sex. Taking low doses of aspirin every day. Taking vitamin and mineral supplements as recommended by your health care provider. What happens during an annual well check? The  services and screenings done by your health care provider during your annual well check will depend on your age, overall health, lifestyle risk factors, and family history of disease. Counseling  Your health care provider may ask you questions about your: Alcohol use. Tobacco use. Drug use. Emotional well-being. Home and relationship well-being. Sexual activity. Eating habits. History of falls. Memory and ability to understand (cognition). Work and work Statistician. Screening  You may have the following tests or measurements: Height, weight, and BMI. Blood pressure. Lipid and cholesterol levels. These may be checked every 5 years, or more frequently if you are over 79 years old. Skin check. Lung cancer screening. You may have this screening every year starting at age 25 if you have a 30-pack-year history of smoking and currently smoke or have quit within the past 15 years. Fecal occult blood test (FOBT) of the stool. You may have this test every year starting at age 76. Flexible sigmoidoscopy or colonoscopy. You may have a sigmoidoscopy every 5 years or a colonoscopy every 10 years starting at age 46. Prostate cancer screening. Recommendations will vary depending on your family history and other risks. Hepatitis C blood test. Hepatitis B blood test. Sexually transmitted disease (STD) testing. Diabetes screening. This is done by checking your blood sugar (glucose) after you have not eaten for a while (fasting). You may have this done every 1-3 years. Abdominal aortic aneurysm (AAA) screening. You may need this if you are a current or former smoker. Osteoporosis. You may be screened starting at age 28 if you are at high risk. Talk with your health care provider about your test results, treatment options, and  if necessary, the need for more tests. Vaccines  Your health care provider may recommend certain vaccines, such as: Influenza vaccine. This is recommended every year. Tetanus,  diphtheria, and acellular pertussis (Tdap, Td) vaccine. You may need a Td booster every 10 years. Zoster vaccine. You may need this after age 17. Pneumococcal 13-valent conjugate (PCV13) vaccine. One dose is recommended after age 59. Pneumococcal polysaccharide (PPSV23) vaccine. One dose is recommended after age 29. Talk to your health care provider about which screenings and vaccines you need and how often you need them. This information is not intended to replace advice given to you by your health care provider. Make sure you discuss any questions you have with your health care provider. Document Released: 08/25/2015 Document Revised: 04/17/2016 Document Reviewed: 05/30/2015 Elsevier Interactive Patient Education  2017 [employer] Prevention in the Home Falls can cause injuries. They can happen to people of all ages. There are many things you can do to make your home safe and to help prevent falls. What can I do on the outside of my home? Regularly fix the edges of walkways and driveways and fix any cracks. Remove anything that might make you trip as you walk through a door, such as a raised step or threshold. Trim any bushes or trees on the path to your home. Use bright outdoor lighting. Clear any walking paths of anything that might make someone trip, such as rocks or tools. Regularly check to see if handrails are loose or broken. Make sure that both sides of any steps have handrails. Any raised decks and porches should have guardrails on the edges. Have any leaves, snow, or ice cleared regularly. Use sand or salt on walking paths during winter. Clean up any spills in your garage right away. This includes oil or grease spills. What can I do in the bathroom? Use night lights. Install grab bars by the toilet and in the tub and shower. Do not use towel bars as grab bars. Use non-skid mats or decals in the tub or shower. If you need to sit down in the shower, use a plastic,  non-slip stool. Keep the floor dry. Clean up any water that spills on the floor as soon as it happens. Remove soap buildup in the tub or shower regularly. Attach bath mats securely with double-sided non-slip rug tape. Do not have throw rugs and other things on the floor that can make you trip. What can I do in the bedroom? Use night lights. Make sure that you have a light by your bed that is easy to reach. Do not use any sheets or blankets that are too big for your bed. They should not hang down onto the floor. Have a firm chair that has side arms. You can use this for support while you get dressed. Do not have throw rugs and other things on the floor that can make you trip. What can I do in the kitchen? Clean up any spills right away. Avoid walking on wet floors. Keep items that you use a lot in easy-to-reach places. If you need to reach something above you, use a strong step stool that has a grab bar. Keep electrical cords out of the way. Do not use floor polish or wax that makes floors slippery. If you must use wax, use non-skid floor wax. Do not have throw rugs and other things on the floor that can make you trip. What can I do with my stairs? Do not leave any  items on the stairs. Make sure that there are handrails on both sides of the stairs and use them. Fix handrails that are broken or loose. Make sure that handrails are as long as the stairways. Check any carpeting to make sure that it is firmly attached to the stairs. Fix any carpet that is loose or worn. Avoid having throw rugs at the top or bottom of the stairs. If you do have throw rugs, attach them to the floor with carpet tape. Make sure that you have a light switch at the top of the stairs and the bottom of the stairs. If you do not have them, ask someone to add them for you. What else can I do to help prevent falls? Wear shoes that: Do not have high heels. Have rubber bottoms. Are comfortable and fit you well. Are closed  at the toe. Do not wear sandals. If you use a stepladder: Make sure that it is fully opened. Do not climb a closed stepladder. Make sure that both sides of the stepladder are locked into place. Ask someone to hold it for you, if possible. Clearly mark and make sure that you can see: Any grab bars or handrails. First and last steps. Where the edge of each step is. Use tools that help you move around (mobility aids) if they are needed. These include: Canes. Walkers. Scooters. Crutches. Turn on the lights when you go into a dark area. Replace any light bulbs as soon as they burn out. Set up your furniture so you have a clear path. Avoid moving your furniture around. If any of your floors are uneven, fix them. If there are any pets around you, be aware of where they are. Review your medicines with your doctor. Some medicines can make you feel dizzy. This can increase your chance of falling. Ask your doctor what other things that you can do to help prevent falls. This information is not intended to replace advice given to you by your health care provider. Make sure you discuss any questions you have with your health care provider. Document Released: 05/25/2009 Document Revised: 01/04/2016 Document Reviewed: 09/02/2014 Elsevier Interactive Patient Education  2017 [employer].

## 2022-02-20 NOTE — Progress Notes (Signed)
Subjective:   David Randall is a 76 y.o. male who presents for Medicare Annual/Subsequent preventive examination.  Virtual Visit via Telephone Note  I connected with  David Randall on 02/20/22 at 12:00 PM EDT by telephone and verified that I am speaking with the correct person using two identifiers.  Location: Patient: Home Provider: [facility] Persons participating in the virtual visit: patient/Nurse Health Advisor   I discussed the limitations, risks, security and privacy concerns of performing an evaluation and management service by telephone and the availability of in person appointments. The patient expressed understanding and agreed to proceed.  Interactive audio and video telecommunications were attempted between this nurse and patient, however failed, due to patient having technical difficulties OR patient did not have access to video capability.  We continued and completed visit with audio only.  Some vital signs may be absent or patient reported.   [name] E [name], LPN   Review of Systems     Cardiac Risk Factors include: advanced age (>34mn, >>28women);male gender;dyslipidemia     Objective:    Today's Vitals   02/20/22 1153  Weight: 164 lb (74.4 kg)   Body mass index is 26.47 kg/m.     02/20/2022   11:56 AM 02/06/2021    1:44 PM 08/20/2017    9:52 AM 02/26/2016    7:42 AM  Advanced Directives  Does Patient Have a Medical Advance Directive? Yes Yes Yes Yes  Type of AParamedicof [place]ber[place]Living will [place]Living will H[place]Living will Living will  Does patient want to make changes to medical advance directive?   No - Patient declined   Copy of [place] Chart? No - copy requested No - copy requested No - copy requested No - copy requested    Current Medications (verified) Outpatient Encounter Medications as of 02/20/2022  Medication Sig   albuterol (VENTOLIN HFA) 108 (90 Base)  MCG/ACT inhaler Inhale 2 puffs into the lungs every 6 (six) hours as needed for wheezing or shortness of breath.   b complex vitamins capsule Take 1 capsule by mouth daily.   clobetasol cream (TEMOVATE) 0.05 % APPLY 2 TIMES DAILY AS NEEDED   fenofibrate 160 MG tablet Take 1 tablet (160 mg total) by mouth daily.   fluticasone (FLONASE) 50 MCG/ACT nasal spray USE 2 SPRAYS IN EACH NOSTRIL ONCE DAILY   Glucosamine-Chondroitin 1500-1200 MG/30ML LIQD Take by mouth.     guaiFENesin (MUCINEX) 600 MG 12 hr tablet Take 600 mg by mouth in the morning, at noon, in the evening, and at bedtime. 2 in the am 2 at pm   loratadine (CLARITIN) 10 MG tablet Take 10 mg by mouth daily.   LYCOPENE PO Take by mouth.   magnesium citrate SOLN Take 1 Bottle by mouth once.   Multiple Vitamin (MULTIVITAMIN) capsule Take 1 capsule by mouth daily.     Omega-3 Fatty Acids (FISH OIL) 1000 MG CAPS Take by mouth. Takes 2 tablets daily   omeprazole (PRILOSEC) 40 MG capsule Take 1 capsule (40 mg total) by mouth daily.   polycarbophil (FIBERCON) 625 MG tablet Take 625 mg by mouth daily.     Probiotic Product (PROBIOTIC PO) Take by mouth.     [DISCONTINUED] amoxicillin-clavulanate (AUGMENTIN) 875-125 MG tablet Take 1 tablet by mouth 2 (two) times daily.   [DISCONTINUED] ondansetron (ZOFRAN) 4 MG tablet Take 1 tablet (4 mg total) by mouth every 8 (eight) hours as needed for nausea or  vomiting.   [DISCONTINUED] predniSONE (DELTASONE) 20 MG tablet Take 1 tablet (20 mg total) by mouth daily with breakfast.   No facility-administered encounter medications on file as of 02/20/2022.    Allergies (verified) Niacin and related and Statins   History: Past Medical History:  Diagnosis Date   Allergy    Bradycardia    GERD (gastroesophageal reflux disease)    Hemorrhoids    Hyperlipidemia    Inguinal hernia    Past Surgical History:  Procedure Laterality Date   COLONOSCOPY N/A 02/26/2016   Procedure: COLONOSCOPY;  Surgeon: Danie Binder, MD;  [facility];  Service: Endoscopy;  Laterality: N/A;  8:30 Am   HERNIA REPAIR     TONSILLECTOMY  08/13/1967   TONSILLECTOMY  1967   Family History  Problem Relation Age of Onset   Cancer Mother        breast, bone   Heart disease Father    Prostate cancer Brother    Cancer Brother        prostate   Cancer Sister        breast   COPD Sister    Social History   Socioeconomic History   Marital status: Married    Spouse name: Not on file   Number of children: 1   Years of education: Not on file   Highest education level: Not on file  Occupational History   Not on file  Tobacco Use   Smoking status: Former    Types: Pipe    Quit date: 08/12/1984    Years since quitting: 37.5   Smokeless tobacco: Never  Vaping Use   Vaping Use: Never used  Substance and Sexual Activity   Alcohol use: No   Drug use: No   Sexual activity: Yes  Other Topics Concern   Not on file  Social History Narrative   Lives home with wife - foster children   Son lives in [place]   Retired, but farms on the side   Social Determinants of Health   Financial Resource Strain: [place]  (02/20/2022)   Overall Financial Resource Strain (CARDIA)    Difficulty of Paying Living Expenses: Not hard at all  Food Insecurity: No Food Insecurity (02/20/2022)   Hunger Vital Sign    Worried About Running Out of Food in the Last Year: Never true    [place] in the Last Year: Never true  Transportation Needs: No Transportation Needs (02/20/2022)   PRAPARE - Hydrologist (Medical): No    Lack of Transportation (Non-Medical): No  Physical Activity: Sufficiently Active (02/20/2022)   Exercise Vital Sign    Days of Exercise per Week: 7 days    Minutes of Exercise per Session: 50 min  Stress: No Stress Concern Present (02/20/2022)   [place]    Feeling of Stress : Not at all  Social  Connections: [place] (02/20/2022)   Social Connection and Isolation Panel [NHANES]    Frequency of Communication with Friends and Family: More than three times a week    Frequency of Social Gatherings with Friends and Family: More than three times a week    Attends Religious Services: 1 to 4 times per year    Active Member of [employer] or Organizations: Yes    Attends Archivist Meetings: 1 to 4 times per year    Marital Status: Married    Tobacco Counseling  Counseling given: Not Answered   Clinical Intake:  Pre-visit preparation completed: Yes  Pain : No/denies pain     BMI - recorded: 26.47 Nutritional Status: BMI 25 -29 Overweight Nutritional Risks: None Diabetes: No  How often do you need to have someone help you when you read instructions, pamphlets, or other written materials from your doctor or pharmacy?: 1 - Never  Diabetic? no  Interpreter Needed?: No  Information entered by :: Evan Mackie, LPN   Activities of Daily Living    02/20/2022   11:59 AM 02/18/2022    8:42 AM  In your present state of health, do you have any difficulty performing the following activities:  Hearing? 0 0  Vision? 0 0  Difficulty concentrating or making decisions? 0 0  Walking or climbing stairs? 0 0  Dressing or bathing? 0 0  Doing errands, shopping? 0 0  Preparing Food and eating ? N N  Using the Toilet? N N  In the past six months, have you accidently leaked urine? N N  Do you have problems with loss of bowel control? N N  Managing your Medications? N N  Managing your Finances? N N  Housekeeping or managing your Housekeeping? N N    Patient Care Team: Dettinger, Fransisca Kaufmann, MD as PCP - General (Family Medicine) Satira Sark, MD as Consulting Physician (Cardiology) Danie Binder, MD (Inactive) as Consulting Physician (Gastroenterology) Celestia Khat, OD (Optometry)  Indicate any recent Medical Services you may have received from other than [facility]  providers in the past year (date may be approximate).     Assessment:   This is a routine wellness examination for Barett.  Hearing/Vision screen Hearing Screening - Comments:: Denies hearing difficulties   Vision Screening - Comments:: Wears rx glasses - up to date with routine eye exams with [facility]  Dietary issues and exercise activities discussed: Current Exercise Habits: Home exercise routine, Type of exercise: strength training/weights;walking, Time (Minutes): 50, Frequency (Times/Week): 7, Weekly Exercise (Minutes/Week): 350, Intensity: Moderate, Exercise limited by: None identified   Goals Addressed             This Visit's Progress    DIET - REDUCE SUGAR INTAKE   On track    Cut back on processed snacks and desserts     Patient Stated       Hopes to stay healthy and active       Depression Screen    02/20/2022   12:01 PM 12/19/2021    9:24 AM 07/30/2021   10:06 AM 03/15/2021    9:51 AM 02/06/2021    1:39 PM 02/24/2020   10:09 AM 02/18/2019    4:01 PM  PHQ 2/9 Scores  PHQ - 2 Score 2 2 0 0 0 0 0  PHQ- 9 Score 4 5 0        Fall Risk    02/20/2022   11:55 AM 02/18/2022    8:42 AM 12/19/2021    9:24 AM 07/30/2021   10:06 AM 03/15/2021    9:51 AM  Fall Risk   Falls in the past year? 0 0 0 0 0  Number falls in past yr: 0 0     Injury with Fall? 0 0     Risk for fall due to : Orthopedic patient      Follow up Falls prevention discussed        [place]:  Any stairs in or around the home? Yes  If so, are there any without handrails? No  Home free of loose throw rugs in walkways, pet beds, electrical cords, etc? Yes  Adequate lighting in your home to reduce risk of falls? Yes   ASSISTIVE DEVICES UTILIZED TO PREVENT FALLS:  Life alert? No  Use of a cane, walker or w/c? No  Grab bars in the bathroom? Yes  Shower chair or bench in shower? No  Elevated toilet seat or a handicapped toilet? No   TIMED UP AND GO:  Was  the test performed? No . Telephonic visit  Cognitive Function:    08/20/2017    9:49 AM  MMSE - Mini Mental State Exam  Orientation to time 5  Orientation to Place 5  Registration 3  Attention/ Calculation 5  Recall 3  Language- name 2 objects 2  Language- repeat 1  Language- follow 3 step command 3  Language- read & follow direction 1  Write a sentence 1  Copy design 1  Total score 30        02/20/2022   11:56 AM 02/06/2021    1:46 PM  6CIT Screen  What Year? 0 points 0 points  What month? 0 points 0 points  What time? 0 points 0 points  Count back from 20 0 points 0 points  Months in reverse 0 points 0 points  Repeat phrase 0 points 0 points  Total Score 0 points 0 points    Immunizations Immunization History  Administered Date(s) Administered   Fluad Quad(high Dose 65+) 06/05/2021   Influenza,inj,Quad PF,6+ Mos 05/25/2019   Influenza,inj,quad, With Preservative 05/29/2017   Influenza-Unspecified 06/22/2018, 06/02/2020   Moderna Sars-Covid-2 Vaccination 09/01/2019, 10/02/2019, 06/20/2020   Pneumococcal Conjugate-13 05/17/2014   Pneumococcal Polysaccharide-23 05/18/2013   Tdap 04/20/2018   Zoster Recombinat (Shingrix) 04/20/2018, 06/22/2018    TDAP status: Up to date  Flu Vaccine status: Up to date  Pneumococcal vaccine status: Up to date  Covid-19 vaccine status: Completed vaccines  Qualifies for Shingles Vaccine? Yes   Zostavax completed Yes   Shingrix Completed?: Yes  Screening Tests Health Maintenance  Topic Date Due   COVID-19 Vaccine (4 - Moderna series) 08/15/2020   INFLUENZA VACCINE  03/12/2022   COLONOSCOPY (Pts 45-38yr Insurance coverage will need to be confirmed)  02/25/2026   TETANUS/TDAP  04/20/2028   Pneumonia Vaccine 74 Years old  Completed   Hepatitis C Screening  Completed   Zoster Vaccines- Shingrix  Completed   HPV VACCINES  Aged Out    Health Maintenance  Health Maintenance Due  Topic Date Due   COVID-19 Vaccine (4 -  Moderna series) 08/15/2020    Colorectal cancer screening: Type of screening: Colonoscopy. Completed 02/26/2016. Repeat every 5-10 years  Lung Cancer Screening: (Low Dose CT Chest recommended if Age 74-80years, 30 pack-year currently smoking OR have quit w/in 15years.) does not qualify.   Additional Screening:  Hepatitis C Screening: does qualify; Completed 04/29/2017  Vision Screening: Recommended annual ophthalmology exams for early detection of glaucoma and other disorders of the eye. Is the patient up to date with their annual eye exam?  Yes  Who is the provider or what is the name of the office in which the patient attends annual eye exams[email or web address] not established with a provider, would they like to be referred to a provider to establish care? No .   Dental Screening: Recommended annual dental exams for proper oral hygiene  Community Resource Referral / Chronic Care Management: CRR required this  visit?  No   CCM required this visit?  No      Plan:     I have personally reviewed and noted the following in the patient's chart:   Medical and social history Use of alcohol, tobacco or illicit drugs  Current medications and supplements including opioid prescriptions. Patient is not currently taking opioid prescriptions. Functional ability and status Nutritional status Physical activity Advanced directives List of other physicians Hospitalizations, surgeries, and ER visits in previous 12 months Vitals Screenings to include cognitive, depression, and falls Referrals and appointments  In addition, I have reviewed and discussed with patient certain preventive protocols, quality metrics, and best practice recommendations. A written personalized care plan for preventive services as well as general preventive health recommendations were provided to patient.     Sandrea Hammond, LPN   3/72/9021   Nurse Notes: None

## 2022-03-01 DIAGNOSIS — M5430 Sciatica, unspecified side: Secondary | ICD-10-CM | POA: Diagnosis not present

## 2022-03-01 DIAGNOSIS — K219 Gastro-esophageal reflux disease without esophagitis: Secondary | ICD-10-CM | POA: Diagnosis not present

## 2022-03-01 DIAGNOSIS — E663 Overweight: Secondary | ICD-10-CM | POA: Diagnosis not present

## 2022-03-01 DIAGNOSIS — G8929 Other chronic pain: Secondary | ICD-10-CM | POA: Diagnosis not present

## 2022-03-01 DIAGNOSIS — M1990 Unspecified osteoarthritis, unspecified site: Secondary | ICD-10-CM | POA: Diagnosis not present

## 2022-03-01 DIAGNOSIS — Z87891 Personal history of nicotine dependence: Secondary | ICD-10-CM | POA: Diagnosis not present

## 2022-03-01 DIAGNOSIS — E785 Hyperlipidemia, unspecified: Secondary | ICD-10-CM | POA: Diagnosis not present

## 2022-03-01 DIAGNOSIS — J309 Allergic rhinitis, unspecified: Secondary | ICD-10-CM | POA: Diagnosis not present

## 2022-03-21 ENCOUNTER — Telehealth: Payer: Self-pay | Admitting: Family Medicine

## 2022-03-21 DIAGNOSIS — E782 Mixed hyperlipidemia: Secondary | ICD-10-CM

## 2022-03-21 DIAGNOSIS — Z0000 Encounter for general adult medical examination without abnormal findings: Secondary | ICD-10-CM

## 2022-03-21 NOTE — Telephone Encounter (Signed)
Labs placed.

## 2022-03-26 ENCOUNTER — Other Ambulatory Visit: Payer: PPO

## 2022-03-26 DIAGNOSIS — Z0000 Encounter for general adult medical examination without abnormal findings: Secondary | ICD-10-CM | POA: Diagnosis not present

## 2022-03-26 DIAGNOSIS — E782 Mixed hyperlipidemia: Secondary | ICD-10-CM

## 2022-03-26 DIAGNOSIS — Z125 Encounter for screening for malignant neoplasm of prostate: Secondary | ICD-10-CM | POA: Diagnosis not present

## 2022-03-27 LAB — LIPID PANEL
Chol/HDL Ratio: 8.9 ratio — ABNORMAL HIGH (ref 0.0–5.0)
Cholesterol, Total: 222 mg/dL — ABNORMAL HIGH (ref 100–199)
HDL: 25 mg/dL — ABNORMAL LOW (ref >39)
LDL Chol Calc (NIH): 166 mg/dL — ABNORMAL HIGH (ref 0–99)
Triglycerides: 164 mg/dL — ABNORMAL HIGH (ref 0–149)
VLDL Cholesterol Cal: 31 mg/dL (ref 5–40)

## 2022-03-27 LAB — CBC WITH DIFFERENTIAL/PLATELET
Basophils Absolute: 0.1 10*3/uL (ref 0.0–0.2)
Basos: 2 %
EOS (ABSOLUTE): 0.4 10*3/uL (ref 0.0–0.4)
Eos: 6 %
Hematocrit: 47.8 % (ref 37.5–51.0)
Hemoglobin: 15.7 g/dL (ref 13.0–17.7)
Immature Grans (Abs): 0 10*3/uL (ref 0.0–0.1)
Immature Granulocytes: 1 %
Lymphocytes Absolute: 2.6 10*3/uL (ref 0.7–3.1)
Lymphs: 40 %
MCH: 27.3 pg (ref 26.6–33.0)
MCHC: 32.8 g/dL (ref 31.5–35.7)
MCV: 83 fL (ref 79–97)
Monocytes Absolute: 0.6 10*3/uL (ref 0.1–0.9)
Monocytes: 10 %
Neutrophils Absolute: 2.9 10*3/uL (ref 1.4–7.0)
Neutrophils: 41 %
Platelets: 304 10*3/uL (ref 150–450)
RBC: 5.75 x10E6/uL (ref 4.14–5.80)
RDW: 13.5 % (ref 11.6–15.4)
WBC: 6.7 10*3/uL (ref 3.4–10.8)

## 2022-03-27 LAB — CMP14+EGFR
ALT: 15 IU/L (ref 0–44)
AST: 17 IU/L (ref 0–40)
Albumin/Globulin Ratio: 1.5 (ref 1.2–2.2)
Albumin: 4.2 g/dL (ref 3.8–4.8)
Alkaline Phosphatase: 44 IU/L (ref 44–121)
BUN/Creatinine Ratio: 17 (ref 10–24)
BUN: 17 mg/dL (ref 8–27)
Bilirubin Total: 0.3 mg/dL (ref 0.0–1.2)
CO2: 25 mmol/L (ref 20–29)
Calcium: 9.8 mg/dL (ref 8.6–10.2)
Chloride: 103 mmol/L (ref 96–106)
Creatinine, Ser: 1.02 mg/dL (ref 0.76–1.27)
Globulin, Total: 2.8 g/dL (ref 1.5–4.5)
Glucose: 88 mg/dL (ref 70–99)
Potassium: 4.7 mmol/L (ref 3.5–5.2)
Sodium: 141 mmol/L (ref 134–144)
Total Protein: 7 g/dL (ref 6.0–8.5)
eGFR: 77 mL/min/{1.73_m2} (ref >59)

## 2022-03-27 LAB — PSA, TOTAL AND FREE
PSA, Free Pct: 26.8 %
PSA, Free: 0.59 ng/mL
Prostate Specific Ag, Serum: 2.2 ng/mL (ref 0.0–4.0)

## 2022-03-28 ENCOUNTER — Encounter: Payer: Self-pay | Admitting: Family Medicine

## 2022-03-28 ENCOUNTER — Ambulatory Visit (INDEPENDENT_AMBULATORY_CARE_PROVIDER_SITE_OTHER): Payer: PPO | Admitting: Family Medicine

## 2022-03-28 VITALS — BP 115/68 | HR 72 | Temp 97.6°F | Ht 66.0 in | Wt 171.0 lb

## 2022-03-28 DIAGNOSIS — E782 Mixed hyperlipidemia: Secondary | ICD-10-CM | POA: Diagnosis not present

## 2022-03-28 DIAGNOSIS — J323 Chronic sphenoidal sinusitis: Secondary | ICD-10-CM | POA: Diagnosis not present

## 2022-03-28 DIAGNOSIS — Z0001 Encounter for general adult medical examination with abnormal findings: Secondary | ICD-10-CM | POA: Diagnosis not present

## 2022-03-28 DIAGNOSIS — K219 Gastro-esophageal reflux disease without esophagitis: Secondary | ICD-10-CM | POA: Diagnosis not present

## 2022-03-28 DIAGNOSIS — Z0000 Encounter for general adult medical examination without abnormal findings: Secondary | ICD-10-CM

## 2022-03-28 MED ORDER — FLUTICASONE PROPIONATE 50 MCG/ACT NA SUSP: 2.0000 | Freq: Every day | NASAL | 1 refills | Status: DC

## 2022-03-28 MED ORDER — OMEPRAZOLE 40 MG PO CPDR: 40.0000 mg | DELAYED_RELEASE_CAPSULE | Freq: Every day | ORAL | 3 refills | Status: DC

## 2022-03-28 MED ORDER — FENOFIBRATE 160 MG PO TABS: 160.0000 mg | ORAL_TABLET | Freq: Every day | ORAL | 3 refills | Status: DC

## 2022-03-28 NOTE — Progress Notes (Signed)
BP 115/68   Pulse 72   Temp 97.6 F (36.4 C)   Ht '5\' 6"'$  (1.676 m)   Wt 171 lb (77.6 kg)   SpO2 97%   BMI 27.60 kg/m    Subjective:   Patient ID: David Randall, male    DOB: 10/11/1945, 76 y.o.   MRN: [id number]  HPI: David Randall is a 76 y.o. male presenting on 03/28/2022 for Medical Management of Chronic Issues (CPE)   HPI Physical exam Patient denies any chest pain, shortness of breath, headaches or vision issues, abdominal complaints, diarrhea, nausea, vomiting, or joint issues.  He gets the occasional knee swelling in the front but is not there currently.  He does get chronic sinus congestion and has been dealing with that off and on for years especially in the wintertime and he wants to see an ear nose throat doctor  Hyperlipidemia Patient is coming in for recheck of his hyperlipidemia. The patient is currently taking fish oils and fenofibrate. They deny any issues with myalgias or history of liver damage from it. They deny any focal numbness or weakness or chest pain.   GERD Patient is currently on omeprazole.  She denies any major symptoms or abdominal pain or belching or burping. She denies any blood in her stool or lightheadedness or dizziness.   Relevant past medical, surgical, family and social history reviewed and updated as indicated. Interim medical history since our last visit reviewed. Allergies and medications reviewed and updated.  Review of Systems  Constitutional:  Negative for chills and fever.  HENT:  Negative for ear pain and tinnitus.   Eyes:  Negative for pain and visual disturbance.  Respiratory:  Negative for cough, shortness of breath and wheezing.   Cardiovascular:  Negative for chest pain, palpitations and leg swelling.  Gastrointestinal:  Negative for abdominal pain, blood in stool, constipation and diarrhea.  Genitourinary:  Negative for dysuria and hematuria.  Musculoskeletal:  Negative for back pain, gait problem and myalgias.  Skin:  Negative  for rash.  Neurological:  Negative for dizziness, weakness and headaches.  Psychiatric/Behavioral:  Negative for suicidal ideas.   All other systems reviewed and are negative.   Per HPI unless specifically indicated above   Allergies as of 03/28/2022       Reactions   Niacin And Related Rash   Rash and itching   Statins Other (See Comments)   Myalgias, fatigue, depression        Medication List        Accurate as of March 28, 2022 11:51 AM. If you have any questions, ask your nurse or doctor.          albuterol 108 (90 Base) MCG/ACT inhaler Commonly known as: VENTOLIN HFA Inhale 2 puffs into the lungs every 6 (six) hours as needed for wheezing or shortness of breath.   b complex vitamins capsule Take 1 capsule by mouth daily.   clobetasol cream 0.05 % Commonly known as: TEMOVATE APPLY 2 TIMES DAILY AS NEEDED   fenofibrate 160 MG tablet Take 1 tablet (160 mg total) by mouth daily.   Fish Oil 1000 MG Caps Take by mouth. Takes 2 tablets daily   fluticasone 50 MCG/ACT nasal spray Commonly known as: FLONASE Place 2 sprays into both nostrils daily.   Glucosamine-Chondroitin 1500-1200 MG/30ML Liqd Take by mouth.   guaiFENesin 600 MG 12 hr tablet Commonly known as: MUCINEX Take 600 mg by mouth in the morning, at noon, in the evening, and at bedtime. 2  in the am 2 at pm   loratadine 10 MG tablet Commonly known as: CLARITIN Take 10 mg by mouth daily.   LYCOPENE PO Take by mouth.   magnesium citrate Soln Take 1 Bottle by mouth once.   multivitamin capsule Take 1 capsule by mouth daily.   omeprazole 40 MG capsule Commonly known as: PRILOSEC Take 1 capsule (40 mg total) by mouth daily.   polycarbophil 625 MG tablet Commonly known as: FIBERCON Take 625 mg by mouth daily.   PROBIOTIC PO Take by mouth.         Objective:   BP 115/68   Pulse 72   Temp 97.6 F (36.4 C)   Ht $R'5\' 6"'Lf$  (1.676 m)   Wt 171 lb (77.6 kg)   SpO2 97%   BMI 27.60 kg/m    Wt Readings from Last 3 Encounters:  03/28/22 171 lb (77.6 kg)  02/20/22 164 lb (74.4 kg)  12/19/21 164 lb (74.4 kg)    Physical Exam Vitals and nursing note reviewed.  Constitutional:      General: He is not in acute distress.    Appearance: He is well-developed. He is not diaphoretic.  HENT:     Right Ear: External ear normal.     Left Ear: External ear normal.     Nose: Nose normal.     Mouth/Throat:     Pharynx: No oropharyngeal exudate.  Eyes:     General: No scleral icterus.       Right eye: No discharge.     Conjunctiva/sclera: Conjunctivae normal.     Pupils: Pupils are equal, round, and reactive to light.  Neck:     Thyroid: No thyromegaly.  Cardiovascular:     Rate and Rhythm: Normal rate and regular rhythm.     Heart sounds: Normal heart sounds. No murmur heard. Pulmonary:     Effort: Pulmonary effort is normal. No respiratory distress.     Breath sounds: Normal breath sounds. No wheezing.  Abdominal:     General: Bowel sounds are normal. There is no distension.     Palpations: Abdomen is soft.     Tenderness: There is no abdominal tenderness. There is no guarding or rebound.  Genitourinary:    Prostate: Enlarged. Not tender and no nodules present.     Rectum: Normal.  Musculoskeletal:        General: Normal range of motion.     Cervical back: Neck supple.  Lymphadenopathy:     Cervical: No cervical adenopathy.  Skin:    General: Skin is warm and dry.     Findings: No rash.  Neurological:     Mental Status: He is alert and oriented to person, place, and time.     Coordination: Coordination normal.  Psychiatric:        Behavior: Behavior normal.     Results for orders placed or performed in visit on 03/26/22  PSA, total and free  Result Value Ref Range   Prostate Specific Ag, Serum 2.2 0.0 - 4.0 ng/mL   PSA, Free 0.59 N/A ng/mL   PSA, Free Pct 26.8 %  Lipid panel  Result Value Ref Range   Cholesterol, Total 222 (H) 100 - 199 mg/dL    Triglycerides 164 (H) 0 - 149 mg/dL   HDL 25 (L) >39 mg/dL   VLDL Cholesterol Cal 31 5 - 40 mg/dL   LDL Chol Calc (NIH) 166 (H) 0 - 99 mg/dL   Chol/HDL Ratio 8.9 (H) 0.0 - 5.0  ratio  CMP14+EGFR  Result Value Ref Range   Glucose 88 70 - 99 mg/dL   BUN 17 8 - 27 mg/dL   Creatinine, Ser 1.02 0.76 - 1.27 mg/dL   eGFR 77 >59 mL/min/1.73   BUN/Creatinine Ratio 17 10 - 24   Sodium 141 134 - 144 mmol/L   Potassium 4.7 3.5 - 5.2 mmol/L   Chloride 103 96 - 106 mmol/L   CO2 25 20 - 29 mmol/L   Calcium 9.8 8.6 - 10.2 mg/dL   Total Protein 7.0 6.0 - 8.5 g/dL   Albumin 4.2 3.8 - 4.8 g/dL   Globulin, Total 2.8 1.5 - 4.5 g/dL   Albumin/Globulin Ratio 1.5 1.2 - 2.2   Bilirubin Total 0.3 0.0 - 1.2 mg/dL   Alkaline Phosphatase 44 44 - 121 IU/L   AST 17 0 - 40 IU/L   ALT 15 0 - 44 IU/L  CBC with Differential/Platelet  Result Value Ref Range   WBC 6.7 3.4 - 10.8 x10E3/uL   RBC 5.75 4.14 - 5.80 x10E6/uL   Hemoglobin 15.7 13.0 - 17.7 g/dL   Hematocrit 47.8 37.5 - 51.0 %   MCV 83 79 - 97 fL   MCH 27.3 26.6 - 33.0 pg   MCHC 32.8 31.5 - 35.7 g/dL   RDW 13.5 11.6 - 15.4 %   Platelets 304 150 - 450 x10E3/uL   Neutrophils 41 Not Estab. %   Lymphs 40 Not Estab. %   Monocytes 10 Not Estab. %   Eos 6 Not Estab. %   Basos 2 Not Estab. %   Neutrophils Absolute 2.9 1.4 - 7.0 x10E3/uL   Lymphocytes Absolute 2.6 0.7 - 3.1 x10E3/uL   Monocytes Absolute 0.6 0.1 - 0.9 x10E3/uL   EOS (ABSOLUTE) 0.4 0.0 - 0.4 x10E3/uL   Basophils Absolute 0.1 0.0 - 0.2 x10E3/uL   Immature Granulocytes 1 Not Estab. %   Immature Grans (Abs) 0.0 0.0 - 0.1 x10E3/uL    Assessment & Plan:   Problem List Items Addressed This Visit       Digestive   GERD (gastroesophageal reflux disease)   Relevant Medications   omeprazole (PRILOSEC) 40 MG capsule     Other   Hyperlipidemia   Relevant Medications   fenofibrate 160 MG tablet   Other Visit Diagnoses     Well adult exam    -  Primary   Chronic sphenoidal sinusitis        Relevant Medications   fluticasone (FLONASE) 50 MCG/ACT nasal spray   Other Relevant Orders   Ambulatory referral to ENT       Doing well and for the most part blood work looks good except for his cholesterol is elevated. Follow up plan: Return in about 6 months (around 09/28/2022) for Cholesterol recheck.  Counseling provided for all of the vaccine components Orders Placed This Encounter  Procedures   Ambulatory referral to ENT    Caryl Pina, MD [facility] 03/28/2022, 11:51 AM

## 2022-04-29 ENCOUNTER — Other Ambulatory Visit: Payer: Self-pay | Admitting: Family Medicine

## 2022-04-29 DIAGNOSIS — B9689 Other specified bacterial agents as the cause of diseases classified elsewhere: Secondary | ICD-10-CM

## 2022-04-30 ENCOUNTER — Encounter: Payer: Self-pay | Admitting: Family Medicine

## 2022-04-30 ENCOUNTER — Telehealth (INDEPENDENT_AMBULATORY_CARE_PROVIDER_SITE_OTHER): Payer: PPO | Admitting: Family Medicine

## 2022-04-30 DIAGNOSIS — U071 COVID-19: Secondary | ICD-10-CM

## 2022-04-30 MED ORDER — PROMETHAZINE-DM 6.25-15 MG/5ML PO SYRP: 2.5000 mL | ORAL_SOLUTION | Freq: Four times a day (QID) | ORAL | 0 refills | Status: DC | PRN

## 2022-04-30 MED ORDER — MOLNUPIRAVIR EUA 200MG CAPSULE: 4.0000 | ORAL_CAPSULE | Freq: Two times a day (BID) | ORAL | 0 refills | Status: AC

## 2022-04-30 NOTE — Progress Notes (Signed)
MyChart Video visit  Subjective: PI:[id number] 19 PCP: Dettinger, Fransisca Kaufmann, MD CZY:SAYTK David Randall is a 76 y.o. male. Patient provides verbal consent for consult held via video.  Due to COVID-19 pandemic this visit was conducted virtually. This visit type was conducted due to national recommendations for restrictions regarding the COVID-19 Pandemic (e.g. social distancing, sheltering in place) in an effort to limit this patient's exposure and mitigate transmission in our community. All issues noted in this document were discussed and addressed.  A physical exam was not performed with this format.   Location of patient: home Location of provider: [facility] Others present for call: none  1. COVID-19 He reports testing positive last evening.  Started with congestion and mild cough and yesterday symptoms got much worse and developed myalgia.  Cough is productive.  He reports some shortness of breath with coughing spells. Using psuedofed, mucinex, DM and motrin. Hydrating without difficulty. No fevers.  ROS: Per HPI  Allergies  Allergen Reactions   Niacin And Related Rash    Rash and itching   Statins Other (See Comments)    Myalgias, fatigue, depression   Past Medical History:  Diagnosis Date   Allergy    Bradycardia    GERD (gastroesophageal reflux disease)    Hemorrhoids    Hyperlipidemia    Inguinal hernia     Current Outpatient Medications:    albuterol (VENTOLIN HFA) 108 (90 Base) MCG/ACT inhaler, INHALE TWO PUFFS BY MOUTH EVERY 6 HOURS AS NEEDED FOR WHEEZING OR SHORTNESS OF BREATH, Disp: 8.5 g, Rfl: 5   b complex vitamins capsule, Take 1 capsule by mouth daily., Disp: , Rfl:    clobetasol cream (TEMOVATE) 0.05 %, APPLY 2 TIMES DAILY AS NEEDED, Disp: 30 g, Rfl: 3   fenofibrate 160 MG tablet, Take 1 tablet (160 mg total) by mouth daily., Disp: 90 tablet, Rfl: 3   fluticasone (FLONASE) 50 MCG/ACT nasal spray, Place 2 sprays into both nostrils daily., Disp: 48 g, Rfl: 1    Glucosamine-Chondroitin 1500-1200 MG/30ML LIQD, Take by mouth.  , Disp: , Rfl:    guaiFENesin (MUCINEX) 600 MG 12 hr tablet, Take 600 mg by mouth in the morning, at noon, in the evening, and at bedtime. 2 in the am 2 at pm, Disp: , Rfl:    loratadine (CLARITIN) 10 MG tablet, Take 10 mg by mouth daily., Disp: , Rfl:    LYCOPENE PO, Take by mouth., Disp: , Rfl:    magnesium citrate SOLN, Take 1 Bottle by mouth once., Disp: , Rfl:    Multiple Vitamin (MULTIVITAMIN) capsule, Take 1 capsule by mouth daily.  , Disp: , Rfl:    Omega-3 Fatty Acids (FISH OIL) 1000 MG CAPS, Take by mouth. Takes 2 tablets daily, Disp: , Rfl:    omeprazole (PRILOSEC) 40 MG capsule, Take 1 capsule (40 mg total) by mouth daily., Disp: 90 capsule, Rfl: 3   polycarbophil (FIBERCON) 625 MG tablet, Take 625 mg by mouth daily.  , Disp: , Rfl:    Probiotic Product (PROBIOTIC PO), Take by mouth.  , Disp: , Rfl:   Gen: Nontoxic male in no acute distress.  Coughing intermittently Pulmonary: Normal work of breathing on room air.  No wheezes appreciated  Assessment/ Plan: 76 y.o. male   COVID-19 - Plan: molnupiravir EUA (LAGEVRIO) 200 mg CAPS capsule, promethazine-dextromethorphan (PROMETHAZINE-DM) 6.25-15 MG/5ML syrup  COVID-positive by home test.  Well within the 5-day window to Start antivirals.  Molnupiravir sent.  Promethazine with dextromethorphan sent for cough if Tessalon  Perles have not been helpful in the past and has had issues with constipation with codeine containing products.  Home care instructions reviewed and red flag signs and symptoms warranting further evaluation discussed.  He will follow-up as needed  Start time: 9:12a End time: 9:17am  Total time spent on patient care (including video visit/ documentation): 5 minutes  [place], [place] [phone]

## 2022-05-09 ENCOUNTER — Encounter: Payer: Self-pay | Admitting: Family Medicine

## 2022-05-09 ENCOUNTER — Ambulatory Visit (INDEPENDENT_AMBULATORY_CARE_PROVIDER_SITE_OTHER): Payer: PPO | Admitting: Family Medicine

## 2022-05-09 VITALS — BP 125/80 | HR 89 | Ht 66.0 in | Wt 169.0 lb

## 2022-05-09 DIAGNOSIS — R058 Other specified cough: Secondary | ICD-10-CM

## 2022-05-09 MED ORDER — FLUTICASONE PROPIONATE 50 MCG/ACT NA SUSP: 2.0000 | Freq: Every day | NASAL | 3 refills | Status: DC

## 2022-05-09 MED ORDER — PREDNISONE 20 MG PO TABS: ORAL_TABLET | ORAL | 0 refills | Status: DC

## 2022-05-09 NOTE — Progress Notes (Signed)
BP 125/80   Pulse 89   Ht '5\' 6"'$  (1.676 m)   Wt 169 lb (76.7 kg)   SpO2 98%   BMI 27.28 kg/m    Subjective:   Patient ID: David Randall, male    DOB: 01-08-1946, 76 y.o.   MRN: [id number]  HPI: David Randall is a 76 y.o. male presenting on 05/09/2022 for Cough (Productive. Present for weeks. Positive home Covid test.)   HPI Cough Patient had COVID a couple weeks ago and has been having a persistent cough since.  It is was productive at first but now it is more dry.  He says he is not having any fevers chills body aches or shortness of breath but is just been that persistent cough that is been bothering him.  He has used some Flonase and some Promethazine DM cough syrup.  It does not seem to be helping.  The Mucinex does help some.  Relevant past medical, surgical, family and social history reviewed and updated as indicated. Interim medical history since our last visit reviewed. Allergies and medications reviewed and updated.  Review of Systems  Constitutional:  Negative for chills and fever.  HENT:  Positive for congestion. Negative for ear discharge, ear pain, postnasal drip, rhinorrhea, sinus pressure, sneezing, sore throat and voice change.   Eyes:  Negative for pain, discharge, redness and visual disturbance.  Respiratory:  Positive for cough. Negative for shortness of breath and wheezing.   Cardiovascular:  Negative for chest pain and leg swelling.  Musculoskeletal:  Negative for gait problem.  Skin:  Negative for rash.  All other systems reviewed and are negative.   Per HPI unless specifically indicated above   Allergies as of 05/09/2022       Reactions   Niacin And Related Rash   Rash and itching   Statins Other (See Comments)   Myalgias, fatigue, depression        Medication List        Accurate as of May 09, 2022  4:03 PM. If you have any questions, ask your nurse or doctor.          albuterol 108 (90 Base) MCG/ACT inhaler Commonly known as:  VENTOLIN HFA INHALE TWO PUFFS BY MOUTH EVERY 6 HOURS AS NEEDED FOR WHEEZING OR SHORTNESS OF BREATH   b complex vitamins capsule Take 1 capsule by mouth daily.   clobetasol cream 0.05 % Commonly known as: TEMOVATE APPLY 2 TIMES DAILY AS NEEDED   fenofibrate 160 MG tablet Take 1 tablet (160 mg total) by mouth daily.   Fish Oil 1000 MG Caps Take by mouth. Takes 2 tablets daily   fluticasone 50 MCG/ACT nasal spray Commonly known as: FLONASE Place 2 sprays into both nostrils daily.   Glucosamine-Chondroitin 1500-1200 MG/30ML Liqd Take by mouth.   guaiFENesin 600 MG 12 hr tablet Commonly known as: MUCINEX Take 600 mg by mouth in the morning, at noon, in the evening, and at bedtime. 2 in the am 2 at pm   loratadine 10 MG tablet Commonly known as: CLARITIN Take 10 mg by mouth daily.   LYCOPENE PO Take by mouth.   magnesium citrate Soln Take 1 Bottle by mouth once.   multivitamin capsule Take 1 capsule by mouth daily.   omeprazole 40 MG capsule Commonly known as: PRILOSEC Take 1 capsule (40 mg total) by mouth daily.   polycarbophil 625 MG tablet Commonly known as: FIBERCON Take 625 mg by mouth daily.   predniSONE 20 MG tablet Commonly  known as: DELTASONE 2 po at same time daily for 5 days   PROBIOTIC PO Take by mouth.   promethazine-dextromethorphan 6.25-15 MG/5ML syrup Commonly known as: PROMETHAZINE-DM Take 2.5 mLs by mouth 4 (four) times daily as needed for cough.         Objective:   BP 125/80   Pulse 89   Ht '5\' 6"'$  (1.676 m)   Wt 169 lb (76.7 kg)   SpO2 98%   BMI 27.28 kg/m   Wt Readings from Last 3 Encounters:  05/09/22 169 lb (76.7 kg)  03/28/22 171 lb (77.6 kg)  02/20/22 164 lb (74.4 kg)    Physical Exam Vitals and nursing note reviewed.  Constitutional:      General: He is not in acute distress.    Appearance: He is well-developed. He is not diaphoretic.  HENT:     Right Ear: Tympanic membrane and ear canal normal.     Left Ear:  Tympanic membrane and ear canal normal.     Mouth/Throat:     Mouth: Mucous membranes are moist.     Pharynx: Oropharynx is clear. No oropharyngeal exudate or posterior oropharyngeal erythema.  Eyes:     General: No scleral icterus.    Conjunctiva/sclera: Conjunctivae normal.  Neck:     Thyroid: No thyromegaly.  Cardiovascular:     Rate and Rhythm: Normal rate and regular rhythm.     Heart sounds: Normal heart sounds. No murmur heard. Pulmonary:     Effort: Pulmonary effort is normal. No respiratory distress.     Breath sounds: Normal breath sounds. No wheezing.  Musculoskeletal:        General: Normal range of motion.     Cervical back: Neck supple.  Lymphadenopathy:     Cervical: No cervical adenopathy.  Skin:    General: Skin is warm and dry.     Findings: No rash.  Neurological:     Mental Status: He is alert and oriented to person, place, and time.     Coordination: Coordination normal.  Psychiatric:        Behavior: Behavior normal.       Assessment & Plan:   Problem List Items Addressed This Visit   None Visit Diagnoses     Post-viral cough syndrome    -  Primary   Relevant Medications   fluticasone (FLONASE) 50 MCG/ACT nasal spray   predniSONE (DELTASONE) 20 MG tablet       We will do short course of prednisone and continue with the Flonase and the Mucinex and see if that helps some.  He is still use the promethazine cough syrup as well. Follow up plan: Return if symptoms worsen or fail to improve.  Counseling provided for all of the vaccine components No orders of the defined types were placed in this encounter.   Caryl Pina, MD [facility] 05/09/2022, 4:03 PM

## 2022-06-06 DIAGNOSIS — J343 Hypertrophy of nasal turbinates: Secondary | ICD-10-CM | POA: Diagnosis not present

## 2022-06-06 DIAGNOSIS — J302 Other seasonal allergic rhinitis: Secondary | ICD-10-CM | POA: Diagnosis not present

## 2022-09-24 ENCOUNTER — Telehealth: Payer: Self-pay | Admitting: Family Medicine

## 2022-09-24 DIAGNOSIS — E782 Mixed hyperlipidemia: Secondary | ICD-10-CM

## 2022-09-24 NOTE — Telephone Encounter (Signed)
Lab orders placed, patient aware

## 2022-09-24 NOTE — Telephone Encounter (Signed)
Please add future lab order. Pt wants to come in on Thursday to do labs before his upcoming appt.

## 2022-09-26 ENCOUNTER — Other Ambulatory Visit: Payer: PPO

## 2022-09-26 DIAGNOSIS — E782 Mixed hyperlipidemia: Secondary | ICD-10-CM | POA: Diagnosis not present

## 2022-09-27 LAB — LIPID PANEL
Chol/HDL Ratio: 7.4 ratio — ABNORMAL HIGH (ref 0.0–5.0)
Cholesterol, Total: 207 mg/dL — ABNORMAL HIGH (ref 100–199)
HDL: 28 mg/dL — ABNORMAL LOW (ref >39)
LDL Chol Calc (NIH): 149 mg/dL — ABNORMAL HIGH (ref 0–99)
Triglycerides: 163 mg/dL — ABNORMAL HIGH (ref 0–149)
VLDL Cholesterol Cal: 30 mg/dL (ref 5–40)

## 2022-09-27 LAB — CMP14+EGFR
ALT: 18 IU/L (ref 0–44)
AST: 19 IU/L (ref 0–40)
Albumin/Globulin Ratio: 1.9 (ref 1.2–2.2)
Albumin: 4.6 g/dL (ref 3.8–4.8)
Alkaline Phosphatase: 44 IU/L (ref 44–121)
BUN/Creatinine Ratio: 19 (ref 10–24)
BUN: 20 mg/dL (ref 8–27)
Bilirubin Total: 0.3 mg/dL (ref 0.0–1.2)
CO2: 23 mmol/L (ref 20–29)
Calcium: 9.8 mg/dL (ref 8.6–10.2)
Chloride: 104 mmol/L (ref 96–106)
Creatinine, Ser: 1.04 mg/dL (ref 0.76–1.27)
Globulin, Total: 2.4 g/dL (ref 1.5–4.5)
Glucose: 86 mg/dL (ref 70–99)
Potassium: 4.4 mmol/L (ref 3.5–5.2)
Sodium: 142 mmol/L (ref 134–144)
Total Protein: 7 g/dL (ref 6.0–8.5)
eGFR: 75 mL/min/{1.73_m2} (ref >59)

## 2022-09-27 LAB — CBC WITH DIFFERENTIAL/PLATELET
Basophils Absolute: 0.1 10*3/uL (ref 0.0–0.2)
Basos: 2 %
EOS (ABSOLUTE): 0.5 10*3/uL — ABNORMAL HIGH (ref 0.0–0.4)
Eos: 7 %
Hematocrit: 46.3 % (ref 37.5–51.0)
Hemoglobin: 15.2 g/dL (ref 13.0–17.7)
Immature Grans (Abs): 0 10*3/uL (ref 0.0–0.1)
Immature Granulocytes: 0 %
Lymphocytes Absolute: 2.5 10*3/uL (ref 0.7–3.1)
Lymphs: 37 %
MCH: 27.8 pg (ref 26.6–33.0)
MCHC: 32.8 g/dL (ref 31.5–35.7)
MCV: 85 fL (ref 79–97)
Monocytes Absolute: 0.7 10*3/uL (ref 0.1–0.9)
Monocytes: 11 %
Neutrophils Absolute: 2.9 10*3/uL (ref 1.4–7.0)
Neutrophils: 43 %
Platelets: 293 10*3/uL (ref 150–450)
RBC: 5.47 x10E6/uL (ref 4.14–5.80)
RDW: 13.1 % (ref 11.6–15.4)
WBC: 6.7 10*3/uL (ref 3.4–10.8)

## 2022-09-30 ENCOUNTER — Ambulatory Visit (INDEPENDENT_AMBULATORY_CARE_PROVIDER_SITE_OTHER): Payer: PPO | Admitting: Family Medicine

## 2022-09-30 ENCOUNTER — Encounter: Payer: Self-pay | Admitting: Family Medicine

## 2022-09-30 VITALS — BP 128/70 | HR 56 | Ht 66.0 in | Wt 169.0 lb

## 2022-09-30 DIAGNOSIS — M5431 Sciatica, right side: Secondary | ICD-10-CM | POA: Diagnosis not present

## 2022-09-30 DIAGNOSIS — E782 Mixed hyperlipidemia: Secondary | ICD-10-CM

## 2022-09-30 DIAGNOSIS — K219 Gastro-esophageal reflux disease without esophagitis: Secondary | ICD-10-CM | POA: Diagnosis not present

## 2022-09-30 MED ORDER — NEXLETOL 180 MG PO TABS: 1.0000 | ORAL_TABLET | Freq: Every day | ORAL | 3 refills | Status: DC

## 2022-09-30 NOTE — Progress Notes (Signed)
BP 128/70   Pulse (!) 56   Ht 5' 6"$  (1.676 m)   Wt 169 lb (76.7 kg)   SpO2 98%   BMI 27.28 kg/m    Subjective:   Patient ID: David Randall, male    DOB: November 24, 1945, 77 y.o.   MRN: [id number]  HPI: David Randall is a 77 y.o. male presenting on 09/30/2022 for Medical Management of Chronic Issues and Hyperlipidemia   HPI Hyperlipidemia Patient is coming in for recheck of his hyperlipidemia. The patient is currently taking fenofibrate and fish oil. They deny any issues with myalgias or history of liver damage from it. They deny any focal numbness or weakness or chest pain.   GERD Patient is currently on omeprazole.  She denies any major symptoms or abdominal pain or belching or burping. She denies any blood in her stool or lightheadedness or dizziness.   Patient gets occasional sciatic pain, he has a little bit of it today and discussed possible treatments for it.  He said he had steroids once before and it helped a lot we discussed that that can only be used so many times per year and he will hold off for today and discuss it more if it gets worse.  Relevant past medical, surgical, family and social history reviewed and updated as indicated. Interim medical history since our last visit reviewed. Allergies and medications reviewed and updated.  Review of Systems  Constitutional:  Negative for chills and fever.  Eyes:  Negative for visual disturbance.  Respiratory:  Negative for shortness of breath and wheezing.   Cardiovascular:  Negative for chest pain and leg swelling.  Musculoskeletal:  Positive for arthralgias and myalgias. Negative for back pain and gait problem.  Skin:  Negative for rash.  Neurological:  Negative for dizziness, weakness and light-headedness.  All other systems reviewed and are negative.   Per HPI unless specifically indicated above   Allergies as of 09/30/2022       Reactions   Niacin And Related Rash   Rash and itching   Statins Other (See Comments)    Myalgias, fatigue, depression        Medication List        Accurate as of September 30, 2022  9:56 AM. If you have any questions, ask your nurse or doctor.          STOP taking these medications    predniSONE 20 MG tablet Commonly known as: DELTASONE Stopped by: Worthy Rancher, MD   promethazine-dextromethorphan 6.25-15 MG/5ML syrup Commonly known as: PROMETHAZINE-DM Stopped by: Fransisca Kaufmann Lanissa Cashen, MD       TAKE these medications    albuterol 108 (90 Base) MCG/ACT inhaler Commonly known as: VENTOLIN HFA INHALE TWO PUFFS BY MOUTH EVERY 6 HOURS AS NEEDED FOR WHEEZING OR SHORTNESS OF BREATH   b complex vitamins capsule Take 1 capsule by mouth daily.   clobetasol cream 0.05 % Commonly known as: TEMOVATE APPLY 2 TIMES DAILY AS NEEDED   fenofibrate 160 MG tablet Take 1 tablet (160 mg total) by mouth daily.   Fish Oil 1000 MG Caps Take by mouth. Takes 2 tablets daily   fluticasone 50 MCG/ACT nasal spray Commonly known as: FLONASE Place 2 sprays into both nostrils daily.   Glucosamine-Chondroitin 1500-1200 MG/30ML Liqd Take by mouth.   guaiFENesin 600 MG 12 hr tablet Commonly known as: MUCINEX Take 600 mg by mouth in the morning, at noon, in the evening, and at bedtime. 2 in the am 2 at  pm   loratadine 10 MG tablet Commonly known as: CLARITIN Take 10 mg by mouth daily.   LYCOPENE PO Take by mouth.   magnesium citrate Soln Take 1 Bottle by mouth once.   multivitamin capsule Take 1 capsule by mouth daily.   Nexletol 180 MG Tabs Generic drug: Bempedoic Acid Take 1 tablet (180 mg total) by mouth daily. Started by: Worthy Rancher, MD   omeprazole 40 MG capsule Commonly known as: PRILOSEC Take 1 capsule (40 mg total) by mouth daily.   polycarbophil 625 MG tablet Commonly known as: FIBERCON Take 625 mg by mouth daily.   PROBIOTIC PO Take by mouth.         Objective:   BP 128/70   Pulse (!) 56   Ht 5' 6"$  (1.676 m)   Wt 169 lb  (76.7 kg)   SpO2 98%   BMI 27.28 kg/m   Wt Readings from Last 3 Encounters:  09/30/22 169 lb (76.7 kg)  05/09/22 169 lb (76.7 kg)  03/28/22 171 lb (77.6 kg)    Physical Exam Vitals and nursing note reviewed.  Constitutional:      General: He is not in acute distress.    Appearance: He is well-developed. He is not diaphoretic.  Eyes:     General: No scleral icterus.    Conjunctiva/sclera: Conjunctivae normal.  Neck:     Thyroid: No thyromegaly.  Cardiovascular:     Rate and Rhythm: Normal rate and regular rhythm.     Heart sounds: Normal heart sounds. No murmur heard. Pulmonary:     Effort: Pulmonary effort is normal. No respiratory distress.     Breath sounds: Normal breath sounds. No wheezing.  Musculoskeletal:        General: No swelling or tenderness. Normal range of motion.     Cervical back: Neck supple.  Lymphadenopathy:     Cervical: No cervical adenopathy.  Skin:    General: Skin is warm and dry.     Findings: No rash.  Neurological:     Mental Status: He is alert and oriented to person, place, and time.     Coordination: Coordination normal.  Psychiatric:        Behavior: Behavior normal.     Results for orders placed or performed in visit on 09/26/22  Lipid panel  Result Value Ref Range   Cholesterol, Total 207 (H) 100 - 199 mg/dL   Triglycerides 163 (H) 0 - 149 mg/dL   HDL 28 (L) >39 mg/dL   VLDL Cholesterol Cal 30 5 - 40 mg/dL   LDL Chol Calc (NIH) 149 (H) 0 - 99 mg/dL   Chol/HDL Ratio 7.4 (H) 0.0 - 5.0 ratio  CMP14+EGFR  Result Value Ref Range   Glucose 86 70 - 99 mg/dL   BUN 20 8 - 27 mg/dL   Creatinine, Ser 1.04 0.76 - 1.27 mg/dL   eGFR 75 >59 mL/min/1.73   BUN/Creatinine Ratio 19 10 - 24   Sodium 142 134 - 144 mmol/L   Potassium 4.4 3.5 - 5.2 mmol/L   Chloride 104 96 - 106 mmol/L   CO2 23 20 - 29 mmol/L   Calcium 9.8 8.6 - 10.2 mg/dL   Total Protein 7.0 6.0 - 8.5 g/dL   Albumin 4.6 3.8 - 4.8 g/dL   Globulin, Total 2.4 1.5 - 4.5 g/dL    Albumin/Globulin Ratio 1.9 1.2 - 2.2   Bilirubin Total 0.3 0.0 - 1.2 mg/dL   Alkaline Phosphatase 44 44 - 121 IU/L  AST 19 0 - 40 IU/L   ALT 18 0 - 44 IU/L  CBC with Differential/Platelet  Result Value Ref Range   WBC 6.7 3.4 - 10.8 x10E3/uL   RBC 5.47 4.14 - 5.80 x10E6/uL   Hemoglobin 15.2 13.0 - 17.7 g/dL   Hematocrit 46.3 37.5 - 51.0 %   MCV 85 79 - 97 fL   MCH 27.8 26.6 - 33.0 pg   MCHC 32.8 31.5 - 35.7 g/dL   RDW 13.1 11.6 - 15.4 %   Platelets 293 150 - 450 x10E3/uL   Neutrophils 43 Not Estab. %   Lymphs 37 Not Estab. %   Monocytes 11 Not Estab. %   Eos 7 Not Estab. %   Basos 2 Not Estab. %   Neutrophils Absolute 2.9 1.4 - 7.0 x10E3/uL   Lymphocytes Absolute 2.5 0.7 - 3.1 x10E3/uL   Monocytes Absolute 0.7 0.1 - 0.9 x10E3/uL   EOS (ABSOLUTE) 0.5 (H) 0.0 - 0.4 x10E3/uL   Basophils Absolute 0.1 0.0 - 0.2 x10E3/uL   Immature Granulocytes 0 Not Estab. %   Immature Grans (Abs) 0.0 0.0 - 0.1 x10E3/uL    Assessment & Plan:   Problem List Items Addressed This Visit       Digestive   GERD (gastroesophageal reflux disease)     Other   Hyperlipidemia - Primary   Relevant Medications   Bempedoic Acid (NEXLETOL) 180 MG TABS   Other Visit Diagnoses     Sciatica of right side           Will try Nexletol for him.  No other change in medication, seems to be doing well other than his cholesterol.  Recommended Voltaren gel for his sciatic pain and then if it worsens to come in and get treatment with steroids. Follow up plan: Return in about 6 months (around 03/31/2023), or if symptoms worsen or fail to improve, for Physical exam and hyperlipidemia recheck.  Counseling provided for all of the vaccine components No orders of the defined types were placed in this encounter.   Caryl Pina, MD [facility] 09/30/2022, 9:56 AM

## 2022-10-07 ENCOUNTER — Telehealth: Payer: Self-pay

## 2022-10-07 NOTE — Telephone Encounter (Signed)
JOHNROSS HAJNY (KeyDella Goo) PA Case ID #: [id number] Rx #: [id number] Need Help? Call us at [phone] Status sent iconSent to Plan today Drug Nexletol '180MG'$  tablets [employer] Electronic Prior Authorization Form 2017 NCPDP

## 2022-10-11 ENCOUNTER — Other Ambulatory Visit (HOSPITAL_COMMUNITY): Payer: Self-pay

## 2022-10-11 NOTE — Telephone Encounter (Signed)
Patient Advocate Encounter  Prior Authorization for Nexletol 180 mg has been approved.    Ran test claim, last filled 10/08/22. Next fill 10/31/22

## 2022-12-13 DIAGNOSIS — R053 Chronic cough: Secondary | ICD-10-CM | POA: Diagnosis not present

## 2022-12-13 DIAGNOSIS — J310 Chronic rhinitis: Secondary | ICD-10-CM | POA: Diagnosis not present

## 2022-12-18 DIAGNOSIS — R053 Chronic cough: Secondary | ICD-10-CM | POA: Diagnosis not present

## 2023-03-06 DIAGNOSIS — G8929 Other chronic pain: Secondary | ICD-10-CM | POA: Diagnosis not present

## 2023-03-06 DIAGNOSIS — K219 Gastro-esophageal reflux disease without esophagitis: Secondary | ICD-10-CM | POA: Diagnosis not present

## 2023-03-06 DIAGNOSIS — E663 Overweight: Secondary | ICD-10-CM | POA: Diagnosis not present

## 2023-03-06 DIAGNOSIS — M5430 Sciatica, unspecified side: Secondary | ICD-10-CM | POA: Diagnosis not present

## 2023-03-06 DIAGNOSIS — R053 Chronic cough: Secondary | ICD-10-CM | POA: Diagnosis not present

## 2023-03-06 DIAGNOSIS — M1990 Unspecified osteoarthritis, unspecified site: Secondary | ICD-10-CM | POA: Diagnosis not present

## 2023-03-06 DIAGNOSIS — I8393 Asymptomatic varicose veins of bilateral lower extremities: Secondary | ICD-10-CM | POA: Diagnosis not present

## 2023-03-06 DIAGNOSIS — E785 Hyperlipidemia, unspecified: Secondary | ICD-10-CM | POA: Diagnosis not present

## 2023-03-06 DIAGNOSIS — J309 Allergic rhinitis, unspecified: Secondary | ICD-10-CM | POA: Diagnosis not present

## 2023-03-06 DIAGNOSIS — Z87891 Personal history of nicotine dependence: Secondary | ICD-10-CM | POA: Diagnosis not present

## 2023-03-06 DIAGNOSIS — L209 Atopic dermatitis, unspecified: Secondary | ICD-10-CM | POA: Diagnosis not present

## 2023-03-17 ENCOUNTER — Other Ambulatory Visit: Payer: Self-pay | Admitting: Family Medicine

## 2023-03-28 ENCOUNTER — Telehealth: Payer: Self-pay | Admitting: Family Medicine

## 2023-03-31 ENCOUNTER — Other Ambulatory Visit: Payer: Self-pay | Admitting: Nurse Practitioner

## 2023-03-31 DIAGNOSIS — E782 Mixed hyperlipidemia: Secondary | ICD-10-CM

## 2023-03-31 DIAGNOSIS — K219 Gastro-esophageal reflux disease without esophagitis: Secondary | ICD-10-CM

## 2023-03-31 DIAGNOSIS — Z8042 Family history of malignant neoplasm of prostate: Secondary | ICD-10-CM

## 2023-04-01 ENCOUNTER — Other Ambulatory Visit: Payer: PPO

## 2023-04-01 DIAGNOSIS — E782 Mixed hyperlipidemia: Secondary | ICD-10-CM

## 2023-04-01 DIAGNOSIS — K219 Gastro-esophageal reflux disease without esophagitis: Secondary | ICD-10-CM

## 2023-04-01 DIAGNOSIS — Z8042 Family history of malignant neoplasm of prostate: Secondary | ICD-10-CM | POA: Diagnosis not present

## 2023-04-02 LAB — CBC WITH DIFFERENTIAL/PLATELET
Basophils Absolute: 0.1 10*3/uL (ref 0.0–0.2)
Basos: 1 %
EOS (ABSOLUTE): 0.5 10*3/uL — ABNORMAL HIGH (ref 0.0–0.4)
Eos: 7 %
Hematocrit: 44.4 % (ref 37.5–51.0)
Hemoglobin: 14.8 g/dL (ref 13.0–17.7)
Immature Grans (Abs): 0 10*3/uL (ref 0.0–0.1)
Immature Granulocytes: 0 %
Lymphocytes Absolute: 2.3 10*3/uL (ref 0.7–3.1)
Lymphs: 35 %
MCH: 28.1 pg (ref 26.6–33.0)
MCHC: 33.3 g/dL (ref 31.5–35.7)
MCV: 84 fL (ref 79–97)
Monocytes Absolute: 0.7 10*3/uL (ref 0.1–0.9)
Monocytes: 10 %
Neutrophils Absolute: 3.1 10*3/uL (ref 1.4–7.0)
Neutrophils: 47 %
Platelets: 303 10*3/uL (ref 150–450)
RBC: 5.27 x10E6/uL (ref 4.14–5.80)
RDW: 13.1 % (ref 11.6–15.4)
WBC: 6.6 10*3/uL (ref 3.4–10.8)

## 2023-04-02 LAB — CMP14+EGFR
ALT: 20 IU/L (ref 0–44)
AST: 21 IU/L (ref 0–40)
Albumin: 4.2 g/dL (ref 3.8–4.8)
Alkaline Phosphatase: 42 IU/L — ABNORMAL LOW (ref 44–121)
BUN/Creatinine Ratio: 15 (ref 10–24)
BUN: 16 mg/dL (ref 8–27)
Bilirubin Total: 0.4 mg/dL (ref 0.0–1.2)
CO2: 24 mmol/L (ref 20–29)
Calcium: 9.9 mg/dL (ref 8.6–10.2)
Chloride: 101 mmol/L (ref 96–106)
Creatinine, Ser: 1.07 mg/dL (ref 0.76–1.27)
Globulin, Total: 2.4 g/dL (ref 1.5–4.5)
Glucose: 82 mg/dL (ref 70–99)
Potassium: 4.4 mmol/L (ref 3.5–5.2)
Sodium: 141 mmol/L (ref 134–144)
Total Protein: 6.6 g/dL (ref 6.0–8.5)
eGFR: 72 mL/min/{1.73_m2} (ref >59)

## 2023-04-02 LAB — LIPID PANEL
Chol/HDL Ratio: 7.8 ratio — ABNORMAL HIGH (ref 0.0–5.0)
Cholesterol, Total: 210 mg/dL — ABNORMAL HIGH (ref 100–199)
HDL: 27 mg/dL — ABNORMAL LOW (ref >39)
LDL Chol Calc (NIH): 155 mg/dL — ABNORMAL HIGH (ref 0–99)
Triglycerides: 154 mg/dL — ABNORMAL HIGH (ref 0–149)
VLDL Cholesterol Cal: 28 mg/dL (ref 5–40)

## 2023-04-02 LAB — THYROID PANEL WITH TSH
Free Thyroxine Index: 2.1 (ref 1.2–4.9)
T3 Uptake Ratio: 29 % (ref 24–39)
T4, Total: 7.4 ug/dL (ref 4.5–12.0)
TSH: 3.06 u[IU]/mL (ref 0.450–4.500)

## 2023-04-02 LAB — PSA, TOTAL AND FREE
PSA, Free Pct: 24.5 %
PSA, Free: 0.49 ng/mL
Prostate Specific Ag, Serum: 2 ng/mL (ref 0.0–4.0)

## 2023-04-04 ENCOUNTER — Encounter: Payer: Self-pay | Admitting: Family Medicine

## 2023-04-04 ENCOUNTER — Ambulatory Visit (INDEPENDENT_AMBULATORY_CARE_PROVIDER_SITE_OTHER): Payer: PPO | Admitting: Family Medicine

## 2023-04-04 VITALS — BP 138/75 | HR 58 | Ht 66.0 in | Wt 167.0 lb

## 2023-04-04 DIAGNOSIS — E782 Mixed hyperlipidemia: Secondary | ICD-10-CM

## 2023-04-04 DIAGNOSIS — Z0001 Encounter for general adult medical examination with abnormal findings: Secondary | ICD-10-CM

## 2023-04-04 DIAGNOSIS — Z0000 Encounter for general adult medical examination without abnormal findings: Secondary | ICD-10-CM

## 2023-04-04 DIAGNOSIS — K219 Gastro-esophageal reflux disease without esophagitis: Secondary | ICD-10-CM | POA: Diagnosis not present

## 2023-04-04 MED ORDER — FENOFIBRATE 160 MG PO TABS
160.0000 mg | ORAL_TABLET | Freq: Every day | ORAL | 3 refills | Status: DC
Start: 2023-04-04 — End: 2024-04-17
  Filled 2023-10-22 – 2023-10-27 (×2): qty 90, 90d supply, fill #0
  Filled 2024-01-17 – 2024-01-20 (×2): qty 90, 90d supply, fill #1

## 2023-04-04 MED ORDER — OMEPRAZOLE 40 MG PO CPDR
40.0000 mg | DELAYED_RELEASE_CAPSULE | Freq: Every day | ORAL | 3 refills | Status: DC
Start: 2023-04-04 — End: 2024-05-10
  Filled 2023-11-19: qty 90, 90d supply, fill #0
  Filled 2024-02-10: qty 90, 90d supply, fill #1

## 2023-04-04 NOTE — Progress Notes (Signed)
BP 138/75   Pulse (!) 58   Ht 5\' 6"  (1.676 m)   Wt 167 lb (75.8 kg)   SpO2 97%   BMI 26.95 kg/m    Subjective:   Patient ID: David Randall, male    DOB: 1945/09/10, 77 y.o.   MRN: [id number]  HPI: David Randall is a 77 y.o. male presenting on 04/04/2023 for Medical Management of Chronic Issues (CPE) and Hyperlipidemia   HPI Physical exam Patient denies any chest pain, shortness of breath, headaches or vision issues, abdominal complaints, diarrhea, nausea, vomiting, or joint issues.  He does get some congestion and has seen an allergist who started him on Advair and ipratropium nasal spray but he does not know if they are helping as much.  GERD Patient is currently on omeprazole.  She denies any major symptoms or abdominal pain or belching or burping. She denies any blood in her stool or lightheadedness or dizziness.   Hyperlipidemia Patient is coming in for recheck of his hyperlipidemia. The patient is currently taking fenofibrate and fish oil, says he could not take the Nexletol or Zetia or statin. They deny any issues with myalgias or history of liver damage from it. They deny any focal numbness or weakness or chest pain.   Relevant past medical, surgical, family and social history reviewed and updated as indicated. Interim medical history since our last visit reviewed. Allergies and medications reviewed and updated.  Review of Systems  Constitutional:  Negative for chills and fever.  HENT:  Negative for ear pain and tinnitus.   Eyes:  Negative for pain and visual disturbance.  Respiratory:  Negative for cough, shortness of breath and wheezing.   Cardiovascular:  Negative for chest pain, palpitations and leg swelling.  Gastrointestinal:  Negative for abdominal pain, blood in stool, constipation and diarrhea.  Genitourinary:  Negative for dysuria and hematuria.  Musculoskeletal:  Negative for back pain, gait problem and myalgias.  Skin:  Negative for rash.  Neurological:   Negative for dizziness, weakness, light-headedness and headaches.  Psychiatric/Behavioral:  Negative for suicidal ideas.   All other systems reviewed and are negative.   Per HPI unless specifically indicated above   Allergies as of 04/04/2023       Reactions   Niacin And Related Rash   Rash and itching   Statins Other (See Comments)   Myalgias, fatigue, depression        Medication List        Accurate as of April 04, 2023  9:13 AM. If you have any questions, ask your nurse or doctor.          STOP taking these medications    guaiFENesin 600 MG 12 hr tablet Commonly known as: MUCINEX Stopped by: Elige Radon Vincent Streater   Nexletol 180 MG Tabs Generic drug: Bempedoic Acid Stopped by: Elige Radon Chucky Homes       TAKE these medications    albuterol 108 (90 Base) MCG/ACT inhaler Commonly known as: VENTOLIN HFA INHALE TWO PUFFS BY MOUTH EVERY 6 HOURS AS NEEDED FOR WHEEZING OR SHORTNESS OF BREATH   b complex vitamins capsule Take 1 capsule by mouth daily.   clobetasol cream 0.05 % Commonly known as: TEMOVATE APPLY 2 TIMES DAILY AS NEEDED   fenofibrate 160 MG tablet Take 1 tablet (160 mg total) by mouth daily.   Fish Oil 1000 MG Caps Take by mouth. Takes 2 tablets daily   fluticasone 50 MCG/ACT nasal spray Commonly known as: FLONASE Place 2 sprays into both  nostrils daily.   Glucosamine-Chondroitin 1500-1200 MG/30ML Liqd Take by mouth.   loratadine 10 MG tablet Commonly known as: CLARITIN Take 10 mg by mouth daily.   LYCOPENE PO Take by mouth.   magnesium citrate Soln Take 1 Bottle by mouth once.   multivitamin capsule Take 1 capsule by mouth daily.   omeprazole 40 MG capsule Commonly known as: PRILOSEC Take 1 capsule (40 mg total) by mouth daily.   polycarbophil 625 MG tablet Commonly known as: FIBERCON Take 625 mg by mouth daily.   PROBIOTIC PO Take by mouth.         Objective:   BP 138/75   Pulse (!) 58   Ht 5\' 6"  (1.676 m)   Wt  167 lb (75.8 kg)   SpO2 97%   BMI 26.95 kg/m   Wt Readings from Last 3 Encounters:  04/04/23 167 lb (75.8 kg)  09/30/22 169 lb (76.7 kg)  05/09/22 169 lb (76.7 kg)    Physical Exam Vitals and nursing note reviewed.  Constitutional:      General: He is not in acute distress.    Appearance: He is well-developed. He is not diaphoretic.  Eyes:     General: No scleral icterus.    Conjunctiva/sclera: Conjunctivae normal.     Pupils: Pupils are equal, round, and reactive to light.  Neck:     Thyroid: No thyromegaly.  Cardiovascular:     Rate and Rhythm: Normal rate and regular rhythm.     Heart sounds: Normal heart sounds. No murmur heard. Pulmonary:     Effort: Pulmonary effort is normal. No respiratory distress.     Breath sounds: Normal breath sounds. No wheezing.  Musculoskeletal:        General: No swelling. Normal range of motion.     Cervical back: Neck supple.  Lymphadenopathy:     Cervical: No cervical adenopathy.  Skin:    General: Skin is warm and dry.     Findings: No rash.  Neurological:     Mental Status: He is alert and oriented to person, place, and time.     Coordination: Coordination normal.  Psychiatric:        Behavior: Behavior normal.     Results for orders placed or performed in visit on 04/01/23  Thyroid Panel With TSH  Result Value Ref Range   TSH 3.060 0.450 - 4.500 uIU/mL   T4, Total 7.4 4.5 - 12.0 ug/dL   T3 Uptake Ratio 29 24 - 39 %   Free Thyroxine Index 2.1 1.2 - 4.9  PSA, total and free  Result Value Ref Range   Prostate Specific Ag, Serum 2.0 0.0 - 4.0 ng/mL   PSA, Free 0.49 N/A ng/mL   PSA, Free Pct 24.5 %  Lipid panel  Result Value Ref Range   Cholesterol, Total 210 (H) 100 - 199 mg/dL   Triglycerides 161 (H) 0 - 149 mg/dL   HDL 27 (L) >09 mg/dL   VLDL Cholesterol Cal 28 5 - 40 mg/dL   LDL Chol Calc (NIH) 604 (H) 0 - 99 mg/dL   Chol/HDL Ratio 7.8 (H) 0.0 - 5.0 ratio  CMP14+EGFR  Result Value Ref Range   Glucose 82 70 - 99  mg/dL   BUN 16 8 - 27 mg/dL   Creatinine, Ser 5.40 0.76 - 1.27 mg/dL   eGFR 72 >98 JX/BJY/7.82   BUN/Creatinine Ratio 15 10 - 24   Sodium 141 134 - 144 mmol/L   Potassium 4.4 3.5 - 5.2  mmol/L   Chloride 101 96 - 106 mmol/L   CO2 24 20 - 29 mmol/L   Calcium 9.9 8.6 - 10.2 mg/dL   Total Protein 6.6 6.0 - 8.5 g/dL   Albumin 4.2 3.8 - 4.8 g/dL   Globulin, Total 2.4 1.5 - 4.5 g/dL   Bilirubin Total 0.4 0.0 - 1.2 mg/dL   Alkaline Phosphatase 42 (L) 44 - 121 IU/L   AST 21 0 - 40 IU/L   ALT 20 0 - 44 IU/L  CBC with Differential/Platelet  Result Value Ref Range   WBC 6.6 3.4 - 10.8 x10E3/uL   RBC 5.27 4.14 - 5.80 x10E6/uL   Hemoglobin 14.8 13.0 - 17.7 g/dL   Hematocrit 16.1 09.6 - 51.0 %   MCV 84 79 - 97 fL   MCH 28.1 26.6 - 33.0 pg   MCHC 33.3 31.5 - 35.7 g/dL   RDW 04.5 40.9 - 81.1 %   Platelets 303 150 - 450 x10E3/uL   Neutrophils 47 Not Estab. %   Lymphs 35 Not Estab. %   Monocytes 10 Not Estab. %   Eos 7 Not Estab. %   Basos 1 Not Estab. %   Neutrophils Absolute 3.1 1.4 - 7.0 x10E3/uL   Lymphocytes Absolute 2.3 0.7 - 3.1 x10E3/uL   Monocytes Absolute 0.7 0.1 - 0.9 x10E3/uL   EOS (ABSOLUTE) 0.5 (H) 0.0 - 0.4 x10E3/uL   Basophils Absolute 0.1 0.0 - 0.2 x10E3/uL   Immature Granulocytes 0 Not Estab. %   Immature Grans (Abs) 0.0 0.0 - 0.1 x10E3/uL    Assessment & Plan:   Problem List Items Addressed This Visit       Digestive   GERD (gastroesophageal reflux disease)   Relevant Medications   omeprazole (PRILOSEC) 40 MG capsule     Other   Hyperlipidemia   Relevant Medications   fenofibrate 160 MG tablet   Other Visit Diagnoses     Physical exam    -  Primary       Continue current medicine, discussed possible Zetia versus other cholesterol medicines, I want him to go ahead and try the Nexletol again but in the evenings. Follow up plan: Return in about 6 months (around 10/05/2023), or if symptoms worsen or fail to improve, for Hyperlipidemia and  GERD.  Counseling provided for all of the vaccine components No orders of the defined types were placed in this encounter.   Arville Care, MD [facility] 04/04/2023, 9:13 AM

## 2023-04-08 ENCOUNTER — Ambulatory Visit (INDEPENDENT_AMBULATORY_CARE_PROVIDER_SITE_OTHER): Payer: PPO

## 2023-04-08 ENCOUNTER — Other Ambulatory Visit: Payer: Self-pay

## 2023-04-08 VITALS — Ht 66.0 in | Wt 165.0 lb

## 2023-04-08 DIAGNOSIS — Z0000 Encounter for general adult medical examination without abnormal findings: Secondary | ICD-10-CM | POA: Diagnosis not present

## 2023-04-08 MED ORDER — EZETIMIBE 10 MG PO TABS: 10.0000 mg | ORAL_TABLET | Freq: Every day | ORAL | 3 refills | Status: DC

## 2023-04-08 NOTE — Progress Notes (Signed)
Subjective:   David Randall is a 77 y.o. male who presents for Medicare Annual/Subsequent preventive examination.  Visit Complete: Virtual  I connected with  David Randall on 04/08/23 by a audio enabled telemedicine application and verified that I am speaking with the correct person using two identifiers.  Patient Location: Home  Provider Location: Home Office  I discussed the limitations of evaluation and management by telemedicine. The patient expressed understanding and agreed to proceed.  Patient Medicare AWV questionnaire was completed by the patient on 04/08/2023; I have confirmed that all information answered by patient is correct and no changes since this date.  Review of Systems    Vital Signs: Unable to obtain new vitals due to this being a telehealth visit.  Cardiac Risk Factors include: advanced age (>59men, >71 women);dyslipidemia;male gender;hypertension     Objective:    Today's Vitals   04/08/23 1305  Weight: 165 lb (74.8 kg)  Height: 5\' 6"  (1.676 m)   Body mass index is 26.63 kg/m.     04/08/2023    1:08 PM 02/20/2022   11:56 AM 02/06/2021    1:44 PM 08/20/2017    9:52 AM 02/26/2016    7:42 AM  Advanced Directives  Does Patient Have a Medical Advance Directive? Yes Yes Yes Yes Yes  Type of Estate agent of [place];Living will Healthcare Power of [place];Living will Healthcare Power of [place];Living will Healthcare Power of [place];Living will Living will  Does patient want to make changes to medical advance directive?    No - Patient declined   Copy of Healthcare Power of Attorney in Chart? No - copy requested No - copy requested No - copy requested No - copy requested No - copy requested    Current Medications (verified) Outpatient Encounter Medications as of 04/08/2023  Medication Sig   albuterol (VENTOLIN HFA) 108 (90 Base) MCG/ACT inhaler INHALE TWO PUFFS BY MOUTH EVERY 6 HOURS AS NEEDED FOR WHEEZING OR SHORTNESS OF BREATH   b  complex vitamins capsule Take 1 capsule by mouth daily.   clobetasol cream (TEMOVATE) 0.05 % APPLY 2 TIMES DAILY AS NEEDED   ezetimibe (ZETIA) 10 MG tablet Take 1 tablet (10 mg total) by mouth daily.   fenofibrate 160 MG tablet Take 1 tablet (160 mg total) by mouth daily.   fluticasone (FLONASE) 50 MCG/ACT nasal spray Place 2 sprays into both nostrils daily.   Glucosamine-Chondroitin 1500-1200 MG/30ML LIQD Take by mouth.     loratadine (CLARITIN) 10 MG tablet Take 10 mg by mouth daily.   LYCOPENE PO Take by mouth.   magnesium citrate SOLN Take 1 Bottle by mouth once.   Multiple Vitamin (MULTIVITAMIN) capsule Take 1 capsule by mouth daily.     Omega-3 Fatty Acids (FISH OIL) 1000 MG CAPS Take by mouth. Takes 2 tablets daily   omeprazole (PRILOSEC) 40 MG capsule Take 1 capsule (40 mg total) by mouth daily.   polycarbophil (FIBERCON) 625 MG tablet Take 625 mg by mouth daily.     Probiotic Product (PROBIOTIC PO) Take by mouth.     No facility-administered encounter medications on file as of 04/08/2023.    Allergies (verified) Niacin and related and Statins   History: Past Medical History:  Diagnosis Date   Allergy    Bradycardia    GERD (gastroesophageal reflux disease)    Hemorrhoids    Hyperlipidemia    Inguinal hernia    Past Surgical History:  Procedure Laterality Date   COLONOSCOPY N/A 02/26/2016   Procedure:  COLONOSCOPY;  Surgeon: West Bali, MD;  Location: [facility];  Service: Endoscopy;  Laterality: N/A;  8:30 Am   HERNIA REPAIR     TONSILLECTOMY  08/13/1967   TONSILLECTOMY  1967   Family History  Problem Relation Age of Onset   Cancer Mother        breast, bone   Heart disease Father    Prostate cancer Brother    Cancer Brother        prostate   Cancer Sister        breast   COPD Sister    Social History   Socioeconomic History   Marital status: Married    Spouse name: Not on file   Number of children: 1   Years of education: Not on file   Highest  education level: Not on file  Occupational History   Not on file  Tobacco Use   Smoking status: Former    Types: Pipe    Quit date: 08/12/1984    Years since quitting: 38.6   Smokeless tobacco: Never  Vaping Use   Vaping status: Never Used  Substance and Sexual Activity   Alcohol use: No   Drug use: No   Sexual activity: Yes  Other Topics Concern   Not on file  Social History Narrative   Lives home with wife - foster children   Son lives in [place]   Retired, but farms on the side   Social Determinants of Health   Financial Resource Strain: Low Risk  (04/08/2023)   Overall Financial Resource Strain (CARDIA)    Difficulty of Paying Living Expenses: Not hard at all  Food Insecurity: No Food Insecurity (04/08/2023)   Hunger Vital Sign    Worried About Running Out of Food in the Last Year: Never true    Ran Out of Food in the Last Year: Never true  Transportation Needs: No Transportation Needs (04/08/2023)   PRAPARE - Administrator, Civil Service (Medical): No    Lack of Transportation (Non-Medical): No  Physical Activity: Sufficiently Active (04/08/2023)   Exercise Vital Sign    Days of Exercise per Week: 5 days    Minutes of Exercise per Session: 30 min  Stress: No Stress Concern Present (04/08/2023)   [employer] of Occupational Health - Occupational Stress Questionnaire    Feeling of Stress : Not at all  Social Connections: Moderately Isolated (04/08/2023)   Social Connection and Isolation Panel [NHANES]    Frequency of Communication with Friends and Family: More than three times a week    Frequency of Social Gatherings with Friends and Family: More than three times a week    Attends Religious Services: Never    Database administrator or Organizations: No    Attends Engineer, structural: Never    Marital Status: Married    Tobacco Counseling Counseling given: Not Answered   Clinical Intake:  Pre-visit preparation completed: Yes  Pain :  No/denies pain     Nutritional Risks: None Diabetes: No  How often do you need to have someone help you when you read instructions, pamphlets, or other written materials from your doctor or pharmacy?: 1 - Never  Interpreter Needed?: No  Information entered by :: Renie Ora, LPN   Activities of Daily Living    04/08/2023    1:09 PM 03/20/2023   12:39 PM  In your present state of health, do you have any difficulty performing the following activities:  Hearing?  0 0  Vision? 0 0  Difficulty concentrating or making decisions? 0 0  Walking or climbing stairs? 0 0  Dressing or bathing? 0 0  Doing errands, shopping? 0 0  Preparing Food and eating ? N N  Using the Toilet? N N  In the past six months, have you accidently leaked urine? N N  Do you have problems with loss of bowel control? N N  Managing your Medications? N N  Managing your Finances? N N  Housekeeping or managing your Housekeeping? N N    Patient Care Team: Dettinger, Elige Radon, MD as PCP - General (Family Medicine) Jonelle Sidle, MD as Consulting Physician (Cardiology) West Bali, MD (Inactive) as Consulting Physician (Gastroenterology) Delora Fuel, OD (Optometry)  Indicate any recent Medical Services you may have received from other than [facility] providers in the past year (date may be approximate).     Assessment:   This is a routine wellness examination for Jewelz.  Hearing/Vision screen Vision Screening - Comments:: Wears rx glasses - up to date with routine eye exams with  Dr.Johnson   Dietary issues and exercise activities discussed:     Goals Addressed             This Visit's Progress    Patient Stated   On track    Hopes to stay healthy and active       Depression Screen    04/08/2023    1:07 PM 04/04/2023    8:22 AM 09/30/2022    9:30 AM 05/09/2022    3:52 PM 03/28/2022   11:15 AM 02/20/2022   12:01 PM 12/19/2021    9:24 AM  PHQ 2/9 Scores  PHQ - 2 Score 0 0 2 2 0 2 2  PHQ- 9  Score 0  5 5 2 4 5     Fall Risk    04/08/2023    1:06 PM 04/04/2023    8:22 AM 03/20/2023   12:39 PM 03/03/2023    8:46 AM 09/30/2022    9:30 AM  Fall Risk   Falls in the past year? 0 0 0 0 0  Number falls in past yr: 0      Injury with Fall? 0      Risk for fall due to : No Fall Risks      Follow up Falls prevention discussed        MEDICARE RISK AT HOME: Medicare Risk at Home Any stairs in or around the home?: Yes If so, are there any without handrails?: No Home free of loose throw rugs in walkways, pet beds, electrical cords, etc?: Yes Adequate lighting in your home to reduce risk of falls?: Yes Life alert?: No Use of a cane, walker or w/c?: No Grab bars in the bathroom?: Yes Shower chair or bench in shower?: Yes Elevated toilet seat or a handicapped toilet?: Yes  TIMED UP AND GO:  Was the test performed?  No    Cognitive Function:    08/20/2017    9:49 AM  MMSE - Mini Mental State Exam  Orientation to time 5  Orientation to Place 5  Registration 3  Attention/ Calculation 5  Recall 3  Language- name 2 objects 2  Language- repeat 1  Language- follow 3 step command 3  Language- read & follow direction 1  Write a sentence 1  Copy design 1  Total score 30        04/08/2023    1:09 PM 02/20/2022  11:56 AM 02/06/2021    1:46 PM  6CIT Screen  What Year? 0 points 0 points 0 points  What month? 0 points 0 points 0 points  What time? 0 points 0 points 0 points  Count back from 20 0 points 0 points 0 points  Months in reverse 0 points 0 points 0 points  Repeat phrase 0 points 0 points 0 points  Total Score 0 points 0 points 0 points    Immunizations Immunization History  Administered Date(s) Administered   Fluad Quad(high Dose 65+) 06/05/2021, 05/23/2022   Influenza,inj,Quad PF,6+ Mos 05/25/2019   Influenza,inj,quad, With Preservative 05/29/2017   Influenza-Unspecified 06/22/2018, 06/02/2020   Moderna Sars-Covid-2 Vaccination 09/01/2019, 10/02/2019, 06/20/2020    Pneumococcal Conjugate-13 05/17/2014   Pneumococcal Polysaccharide-23 05/18/2013   Respiratory Syncytial Virus Vaccine,Recomb Aduvanted(Arexvy) 05/23/2022   Tdap 04/20/2018   Zoster Recombinant(Shingrix) 04/20/2018, 06/22/2018, 06/18/2022, 06/18/2022    TDAP status: Up to date  Flu Vaccine status: Up to date  Pneumococcal vaccine status: Up to date  Covid-19 vaccine status: Completed vaccines  Qualifies for Shingles Vaccine? Yes   Zostavax completed Yes   Shingrix Completed?: Yes  Screening Tests Health Maintenance  Topic Date Due   COVID-19 Vaccine (4 - 2023-24 season) 04/12/2022   INFLUENZA VACCINE  03/13/2023   Medicare Annual Wellness (AWV)  04/07/2024   Colonoscopy  02/25/2026   DTaP/Tdap/Td (2 - Td or Tdap) 04/20/2028   Pneumonia Vaccine 90+ Years old  Completed   Hepatitis C Screening  Completed   Zoster Vaccines- Shingrix  Completed   HPV VACCINES  Aged Out    Health Maintenance  Health Maintenance Due  Topic Date Due   COVID-19 Vaccine (4 - 2023-24 season) 04/12/2022   INFLUENZA VACCINE  03/13/2023    Colorectal cancer screening: Type of screening: Colonoscopy. Completed 02/26/2016. Repeat every 10 years  Lung Cancer Screening: (Low Dose CT Chest recommended if Age 34-80 years, 20 pack-year currently smoking OR have quit w/in 15years.) does not qualify.   Lung Cancer Screening Referral: n/a  Additional Screening:  Hepatitis C Screening: does not qualify; Completed 04/29/2017  Vision Screening: Recommended annual ophthalmology exams for early detection of glaucoma and other disorders of the eye. Is the patient up to date with their annual eye exam?  Yes  Who is the provider or what is the name of the office in which the patient attends annual eye exams? Dr.Johnson  If pt is not established with a provider, would they like to be referred to a provider to establish care? No .   Dental Screening: Recommended annual dental exams for proper oral  hygiene    Community Resource Referral / Chronic Care Management: CRR required this visit?  No   CCM required this visit?  No     Plan:     I have personally reviewed and noted the following in the patient's chart:   Medical and social history Use of alcohol, tobacco or illicit drugs  Current medications and supplements including opioid prescriptions. Patient is not currently taking opioid prescriptions. Functional ability and status Nutritional status Physical activity Advanced directives List of other physicians Hospitalizations, surgeries, and ER visits in previous 12 months Vitals Screenings to include cognitive, depression, and falls Referrals and appointments  In addition, I have reviewed and discussed with patient certain preventive protocols, quality metrics, and best practice recommendations. A written personalized care plan for preventive services as well as general preventive health recommendations were provided to patient.     Lorrene Reid,  LPN   1/61/0960   After Visit Summary: (MyChart) Due to this being a telephonic visit, the after visit summary with patients personalized plan was offered to patient via MyChart   Nurse Notes: none

## 2023-04-08 NOTE — Patient Instructions (Signed)
David Randall , Thank you for taking time to come for your Medicare Wellness Visit. I appreciate your ongoing commitment to your health goals. Please review the following plan we discussed and let me know if I can assist you in the future.   Referrals/Orders/Follow-Ups/Clinician Recommendations: Aim for 30 minutes of exercise or brisk walking, 6-8 glasses of water, and 5 servings of fruits and vegetables each day.   This is a list of the screening recommended for you and due dates:  Health Maintenance  Topic Date Due   COVID-19 Vaccine (4 - 2023-24 season) 04/12/2022   Flu Shot  03/13/2023   Medicare Annual Wellness Visit  04/07/2024   Colon Cancer Screening  02/25/2026   DTaP/Tdap/Td vaccine (2 - Td or Tdap) 04/20/2028   Pneumonia Vaccine  Completed   Hepatitis C Screening  Completed   Zoster (Shingles) Vaccine  Completed   HPV Vaccine  Aged Out    Advanced directives: (Copy Requested) Please bring a copy of your health care power of attorney and living will to the office to be added to your chart at your convenience.  Next Medicare Annual Wellness Visit scheduled for next year: Yes  insert Preventive Care attachment Insert FALL PREVENTION attachment if needed

## 2023-05-16 DIAGNOSIS — R053 Chronic cough: Secondary | ICD-10-CM | POA: Diagnosis not present

## 2023-05-16 DIAGNOSIS — J310 Chronic rhinitis: Secondary | ICD-10-CM | POA: Diagnosis not present

## 2023-05-16 DIAGNOSIS — Z720 Tobacco use: Secondary | ICD-10-CM | POA: Diagnosis not present

## 2023-05-26 ENCOUNTER — Telehealth (INDEPENDENT_AMBULATORY_CARE_PROVIDER_SITE_OTHER): Payer: PPO | Admitting: Family Medicine

## 2023-05-26 ENCOUNTER — Encounter: Payer: Self-pay | Admitting: Family Medicine

## 2023-05-26 DIAGNOSIS — J329 Chronic sinusitis, unspecified: Secondary | ICD-10-CM | POA: Diagnosis not present

## 2023-05-26 DIAGNOSIS — J40 Bronchitis, not specified as acute or chronic: Secondary | ICD-10-CM

## 2023-05-26 MED ORDER — PREDNISONE 10 MG PO TABS: ORAL_TABLET | ORAL | 0 refills | Status: DC

## 2023-05-26 MED ORDER — MOXIFLOXACIN HCL 400 MG PO TABS: 400.0000 mg | ORAL_TABLET | Freq: Every day | ORAL | 0 refills | Status: DC

## 2023-05-26 NOTE — Progress Notes (Signed)
Subjective:    Patient ID: David Randall, male    DOB: 04-Aug-1946, 77 y.o.   MRN: [id number]   HPI: David Randall is a 77 y.o. male presenting for Cough and cold for 11 days. Cough persists. No fever. Mild intermittent dyspnea. Chest tight in the evening. No asthma. Non smoker.       04/08/2023    1:07 PM 04/04/2023    8:23 AM 04/04/2023    8:22 AM 09/30/2022    9:30 AM 05/09/2022    3:52 PM  Depression screen PHQ 2/9  Decreased Interest 0  0 1 1  Down, Depressed, Hopeless 0  0 1 1  PHQ - 2 Score 0  0 2 2  Altered sleeping 0 0  1 1  Tired, decreased energy 0 0  1 1  Change in appetite 0 0  1 1  Feeling bad or failure about yourself  0 0  0 0  Trouble concentrating 0 0  0 0  Moving slowly or fidgety/restless 0 0  0 0  Suicidal thoughts 0 0  0 0  PHQ-9 Score 0   5 5  Difficult doing work/chores Not difficult at all Not difficult at all  Not difficult at all Not difficult at all     Relevant past medical, surgical, family and social history reviewed and updated as indicated.  Interim medical history since our last visit reviewed. Allergies and medications reviewed and updated.  ROS:  Review of Systems  Constitutional:  Negative for activity change, appetite change, chills and fever.  HENT:  Positive for congestion. Negative for rhinorrhea, sinus pressure, sneezing and trouble swallowing.   Respiratory:  Positive for cough, chest tightness and shortness of breath.   Cardiovascular:  Negative for chest pain and palpitations.  Skin:  Negative for rash.     Social History   Tobacco Use  Smoking Status Former   Types: Pipe   Quit date: 08/12/1984   Years since quitting: 38.8  Smokeless Tobacco Never       Objective:     Wt Readings from Last 3 Encounters:  04/08/23 165 lb (74.8 kg)  04/04/23 167 lb (75.8 kg)  09/30/22 169 lb (76.7 kg)     Exam deferred. Video visit performed.   Assessment & Plan:   1. Sinobronchitis     Meds ordered this encounter   Medications   predniSONE (DELTASONE) 10 MG tablet    Sig: Take 5 daily for 2 days followed by 4,3,2 and 1 for 2 days each.    Dispense:  30 tablet    Refill:  0   moxifloxacin (AVELOX) 400 MG tablet    Sig: Take 1 tablet (400 mg total) by mouth daily.    Dispense:  10 tablet    Refill:  0    No orders of the defined types were placed in this encounter.     Diagnoses and all orders for this visit:  Sinobronchitis  Other orders -     predniSONE (DELTASONE) 10 MG tablet; Take 5 daily for 2 days followed by 4,3,2 and 1 for 2 days each. -     moxifloxacin (AVELOX) 400 MG tablet; Take 1 tablet (400 mg total) by mouth daily.    Virtual Visit  Note  I discussed the limitations, risks, security and privacy concerns of performing an evaluation and management service by video and the availability of in person appointments. The patient was identified with two identifiers. Pt.expressed understanding and agreed to  proceed. Pt. Is at home. Dr. Darlyn Read is in his office.  Follow Up Instructions:   I discussed the assessment and treatment plan with the patient. The patient was provided an opportunity to ask questions and all were answered. The patient agreed with the plan and demonstrated an understanding of the instructions.   The patient was advised to call back or seek an in-person evaluation if the symptoms worsen or if the condition fails to improve as anticipated.   Total minutes contact time: 13   Follow up plan: Return if symptoms worsen or fail to improve.  Mechele Claude, MD [facility]

## 2023-08-22 ENCOUNTER — Other Ambulatory Visit (HOSPITAL_COMMUNITY): Payer: Self-pay

## 2023-08-27 ENCOUNTER — Other Ambulatory Visit (HOSPITAL_COMMUNITY): Payer: Self-pay

## 2023-08-29 ENCOUNTER — Other Ambulatory Visit (HOSPITAL_COMMUNITY): Payer: Self-pay

## 2023-08-29 ENCOUNTER — Other Ambulatory Visit (HOSPITAL_BASED_OUTPATIENT_CLINIC_OR_DEPARTMENT_OTHER): Payer: Self-pay

## 2023-09-01 ENCOUNTER — Other Ambulatory Visit (HOSPITAL_COMMUNITY): Payer: Self-pay

## 2023-09-01 MED ORDER — NEXLETOL 180 MG PO TABS: 180.0000 mg | ORAL_TABLET | Freq: Every day | ORAL | 3 refills | Status: DC

## 2023-09-01 MED ORDER — BUDESONIDE-FORMOTEROL FUMARATE 160-4.5 MCG/ACT IN AERO: 2.0000 | INHALATION_SPRAY | Freq: Two times a day (BID) | RESPIRATORY_TRACT | 11 refills | Status: DC | PRN

## 2023-09-01 MED ORDER — FLUTICASONE-SALMETEROL 115-21 MCG/ACT IN AERO: 2.0000 | INHALATION_SPRAY | Freq: Two times a day (BID) | RESPIRATORY_TRACT | 6 refills | Status: DC

## 2023-09-01 MED ORDER — IPRATROPIUM BROMIDE 0.03 % NA SOLN
1.0000 | Freq: Two times a day (BID) | NASAL | 3 refills | Status: DC
  Filled 2023-11-26: qty 60, 30d supply, fill #0

## 2023-09-02 ENCOUNTER — Other Ambulatory Visit (HOSPITAL_COMMUNITY): Payer: Self-pay

## 2023-09-23 ENCOUNTER — Telehealth: Payer: Self-pay

## 2023-09-23 ENCOUNTER — Telehealth: Payer: HMO | Admitting: Family Medicine

## 2023-09-23 DIAGNOSIS — R051 Acute cough: Secondary | ICD-10-CM

## 2023-09-23 DIAGNOSIS — J111 Influenza due to unidentified influenza virus with other respiratory manifestations: Secondary | ICD-10-CM

## 2023-09-23 LAB — VERITOR FLU A/B WAIVED
Influenza A: POSITIVE — AB
Influenza B: NEGATIVE

## 2023-09-23 MED ORDER — OSELTAMIVIR PHOSPHATE 75 MG PO CAPS: 75.0000 mg | ORAL_CAPSULE | Freq: Two times a day (BID) | ORAL | 0 refills | Status: DC

## 2023-09-23 NOTE — Progress Notes (Unsigned)
Subjective:    Patient ID: David Randall, male    DOB: 1946/07/09, 78 y.o.   MRN: [id number]   HPI: David Randall is a 78 y.o. male presenting for cough, no fever. Onset 2/6. Cough Is dry. 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 Covid negative. Chest aches from deep cough, but no myalgias.      04/08/2023    1:07 PM 04/04/2023    8:23 AM 04/04/2023    8:22 AM 09/30/2022    9:30 AM 05/09/2022    3:52 PM  Depression screen PHQ 2/9  Decreased Interest 0  0 1 1  Down, Depressed, Hopeless 0  0 1 1  PHQ - 2 Score 0  0 2 2  Altered sleeping 0 0  1 1  Tired, decreased energy 0 0  1 1  Change in appetite 0 0  1 1  Feeling bad or failure about yourself  0 0  0 0  Trouble concentrating 0 0  0 0  Moving slowly or fidgety/restless 0 0  0 0  Suicidal thoughts 0 0  0 0  PHQ-9 Score 0   5 5  Difficult doing work/chores Not difficult at all Not difficult at all  Not difficult at all Not difficult at all     Relevant past medical, surgical, family and social history reviewed and updated as indicated.  Interim medical history since our last visit reviewed. Allergies and medications reviewed and updated.  ROS:  Review of Systems  Constitutional:  Negative for activity change, appetite change, chills and fever.  HENT:  Positive for congestion and rhinorrhea. Negative for ear discharge, ear pain, hearing loss, nosebleeds, sneezing and trouble swallowing.   Respiratory:  Positive for chest tightness and shortness of breath (with exertion).   Cardiovascular:  Negative for palpitations.  Gastrointestinal: Negative.   Skin:  Negative for rash.     Social History   Tobacco Use  Smoking Status Former  . Types: Pipe  . Quit date: 08/12/1984  . Years since quitting: 39.1  Smokeless Tobacco Never       Objective:     Wt Readings from Last 3 Encounters:  04/08/23 165 lb (74.8 kg)  04/04/23 167 lb (75.8 kg)  09/30/22 169 lb (76.7 kg)     Exam deferred.  Video visit performed.   Assessment & Plan:   1. Influenza with respiratory manifestation   2. Acute cough     Meds ordered this encounter  Medications  . oseltamivir (TAMIFLU) 75 MG capsule    Sig: Take 1 capsule (75 mg total) by mouth 2 (two) times daily.    Dispense:  10 capsule    Refill:  0    Orders Placed This Encounter  Procedures  . COVID-19, Flu A+B and RSV    Previously tested for COVID-19:   Yes    Resident in a congregate (group) care setting:   No    Is the patient student?:   No    Employed in healthcare setting:   No    Has patient completed COVID vaccination(s) (2 doses of Pfizer/Moderna 1 dose of [employer]):   Unknown    Release to patient:   Immediate  . Veritor Flu A/B Waived    Source:   nasal swab    Release to patient:   Immediate      Diagnoses and all orders for this visit:  Influenza with respiratory manifestation  Acute cough -     COVID-19, Flu A+B and RSV -     Veritor Flu A/B Waived  Other orders -     oseltamivir (TAMIFLU) 75 MG capsule; Take 1 capsule (75 mg total) by mouth 2 (two) times daily.    Virtual Visit  Note  I discussed the limitations, risks, security and privacy concerns of performing an evaluation and management service by video and the availability of in person appointments. The patient was identified with two identifiers. Pt.expressed understanding and agreed to proceed. Pt. Is at home. Dr. Darlyn Read is in his office.  Follow Up Instructions:   I discussed the assessment and treatment plan with the patient. The patient was provided an opportunity to ask questions and all were answered. The patient agreed with the plan and demonstrated an understanding of the instructions.   The patient was advised to call back or seek an in-person evaluation if the symptoms worsen or if the condition fails to improve as anticipated.   Total minutes contact time: ***   Follow up plan: No follow-ups on file.  Mechele Claude,  MD [facility]

## 2023-09-24 ENCOUNTER — Other Ambulatory Visit: Payer: Self-pay | Admitting: Family Medicine

## 2023-09-24 LAB — COVID-19, FLU A+B AND RSV
Influenza A, NAA: DETECTED — AB
Influenza B, NAA: NOT DETECTED
RSV, NAA: NOT DETECTED
SARS-CoV-2, NAA: NOT DETECTED

## 2023-09-24 MED ORDER — OSELTAMIVIR PHOSPHATE 75 MG PO CAPS: 75.0000 mg | ORAL_CAPSULE | Freq: Two times a day (BID) | ORAL | 0 refills | Status: DC

## 2023-09-24 NOTE — Telephone Encounter (Signed)
Yes please go ahead and send to treating provider, looks like they saw Dr. Darlyn Read virtually yesterday and he was 1 that prescribed it Thank you

## 2023-09-24 NOTE — Telephone Encounter (Signed)
Patient notified.Marland Kitchen LS

## 2023-09-24 NOTE — Telephone Encounter (Signed)
Please let the patient know that I sent their prescription to their pharmacy. Thanks, WS

## 2023-09-25 ENCOUNTER — Other Ambulatory Visit: Payer: Self-pay | Admitting: Family Medicine

## 2023-09-25 ENCOUNTER — Telehealth: Payer: Self-pay | Admitting: Family Medicine

## 2023-09-25 ENCOUNTER — Other Ambulatory Visit: Payer: Self-pay

## 2023-09-25 DIAGNOSIS — J111 Influenza due to unidentified influenza virus with other respiratory manifestations: Secondary | ICD-10-CM

## 2023-09-25 DIAGNOSIS — J101 Influenza due to other identified influenza virus with other respiratory manifestations: Secondary | ICD-10-CM

## 2023-09-25 DIAGNOSIS — Z8042 Family history of malignant neoplasm of prostate: Secondary | ICD-10-CM

## 2023-09-25 DIAGNOSIS — E782 Mixed hyperlipidemia: Secondary | ICD-10-CM

## 2023-09-25 DIAGNOSIS — R059 Cough, unspecified: Secondary | ICD-10-CM

## 2023-09-25 NOTE — Telephone Encounter (Signed)
Copied from CRM [id number]. Topic: Clinical - Request for Lab/Test Order >> Sep 25, 2023  9:10 AM Elle L wrote: Reason for CRM: The patient is requesting lab orders and a lab appointment for his appointment on the 20th. His call back number is [phone].

## 2023-09-25 NOTE — Telephone Encounter (Unsigned)
Copied from CRM [id number]. Topic: Clinical - Prescription Issue >> Sep 25, 2023  4:37 PM Victorino Dike T wrote: Reason for CRM: guaiFENesin-codeine (CHERATUSSIN AC) 100-10 MG/5ML syrup - pharmacy states they are not able to fill until seen by doctor but he had his appt on Monday with [facility], please call patient [phone] and let him know

## 2023-09-25 NOTE — Telephone Encounter (Signed)
Lab appt made for 2/17 at 9:30am. Pt made aware. Future lab orders placed.

## 2023-09-26 ENCOUNTER — Encounter: Payer: Self-pay | Admitting: Family Medicine

## 2023-09-26 MED ORDER — GUAIFENESIN-CODEINE 100-10 MG/5ML PO SOLN: 5.0000 mL | Freq: Four times a day (QID) | ORAL | 0 refills | Status: AC | PRN

## 2023-09-26 NOTE — Addendum Note (Signed)
Addended by: Raliegh Ip on: 09/26/2023 03:39 PM   Modules accepted: Orders

## 2023-09-26 NOTE — Telephone Encounter (Signed)
Requiring escribed rx for controlled med.  Rx sent. Follow up with PCP/ Prescribing MD if no improvement.  The Narcotic Database has been reviewed.  There were no red flags.    Meds ordered this encounter  Medications   guaiFENesin-codeine 100-10 MG/5ML syrup    Sig: Take 5 mLs by mouth every 6 (six) hours as needed for cough.    Dispense:  120 mL    Refill:  0

## 2023-09-26 NOTE — Telephone Encounter (Signed)
Please call in

## 2023-09-29 ENCOUNTER — Other Ambulatory Visit: Payer: HMO

## 2023-09-29 DIAGNOSIS — E782 Mixed hyperlipidemia: Secondary | ICD-10-CM

## 2023-09-29 DIAGNOSIS — Z8042 Family history of malignant neoplasm of prostate: Secondary | ICD-10-CM | POA: Diagnosis not present

## 2023-09-30 LAB — PSA, TOTAL AND FREE
PSA, Free Pct: 21.4 %
PSA, Free: 0.6 ng/mL
Prostate Specific Ag, Serum: 2.8 ng/mL (ref 0.0–4.0)

## 2023-09-30 LAB — CMP14+EGFR
ALT: 22 [IU]/L (ref 0–44)
AST: 22 [IU]/L (ref 0–40)
Albumin: 4.2 g/dL (ref 3.8–4.8)
Alkaline Phosphatase: 48 [IU]/L (ref 44–121)
BUN/Creatinine Ratio: 21 (ref 10–24)
BUN: 20 mg/dL (ref 8–27)
Bilirubin Total: 0.4 mg/dL (ref 0.0–1.2)
CO2: 25 mmol/L (ref 20–29)
Calcium: 9.6 mg/dL (ref 8.6–10.2)
Chloride: 102 mmol/L (ref 96–106)
Creatinine, Ser: 0.97 mg/dL (ref 0.76–1.27)
Globulin, Total: 2.6 g/dL (ref 1.5–4.5)
Glucose: 84 mg/dL (ref 70–99)
Potassium: 5.2 mmol/L (ref 3.5–5.2)
Sodium: 139 mmol/L (ref 134–144)
Total Protein: 6.8 g/dL (ref 6.0–8.5)
eGFR: 81 mL/min/{1.73_m2} (ref >59)

## 2023-09-30 LAB — CBC WITH DIFFERENTIAL/PLATELET
Basophils Absolute: 0.1 10*3/uL (ref 0.0–0.2)
Basos: 1 %
EOS (ABSOLUTE): 0.3 10*3/uL (ref 0.0–0.4)
Eos: 4 %
Hematocrit: 46.5 % (ref 37.5–51.0)
Hemoglobin: 15.4 g/dL (ref 13.0–17.7)
Immature Grans (Abs): 0.1 10*3/uL (ref 0.0–0.1)
Immature Granulocytes: 1 %
Lymphocytes Absolute: 2.3 10*3/uL (ref 0.7–3.1)
Lymphs: 30 %
MCH: 28.2 pg (ref 26.6–33.0)
MCHC: 33.1 g/dL (ref 31.5–35.7)
MCV: 85 fL (ref 79–97)
Monocytes Absolute: 0.8 10*3/uL (ref 0.1–0.9)
Monocytes: 10 %
Neutrophils Absolute: 4.1 10*3/uL (ref 1.4–7.0)
Neutrophils: 54 %
Platelets: 349 10*3/uL (ref 150–450)
RBC: 5.46 x10E6/uL (ref 4.14–5.80)
RDW: 12.9 % (ref 11.6–15.4)
WBC: 7.6 10*3/uL (ref 3.4–10.8)

## 2023-09-30 LAB — LIPID PANEL
Chol/HDL Ratio: 7.7 {ratio} — ABNORMAL HIGH (ref 0.0–5.0)
Cholesterol, Total: 176 mg/dL (ref 100–199)
HDL: 23 mg/dL — ABNORMAL LOW (ref >39)
LDL Chol Calc (NIH): 123 mg/dL — ABNORMAL HIGH (ref 0–99)
Triglycerides: 167 mg/dL — ABNORMAL HIGH (ref 0–149)
VLDL Cholesterol Cal: 30 mg/dL (ref 5–40)

## 2023-10-02 ENCOUNTER — Ambulatory Visit: Payer: HMO | Admitting: Family Medicine

## 2023-10-02 ENCOUNTER — Encounter: Payer: Self-pay | Admitting: Family Medicine

## 2023-10-02 VITALS — BP 114/72 | HR 63 | Ht 66.0 in | Wt 176.0 lb

## 2023-10-02 DIAGNOSIS — K219 Gastro-esophageal reflux disease without esophagitis: Secondary | ICD-10-CM | POA: Diagnosis not present

## 2023-10-02 DIAGNOSIS — E782 Mixed hyperlipidemia: Secondary | ICD-10-CM

## 2023-10-02 MED ORDER — BREZTRI AEROSPHERE 160-9-4.8 MCG/ACT IN AERO: 2.0000 | INHALATION_SPRAY | Freq: Two times a day (BID) | RESPIRATORY_TRACT | Status: AC

## 2023-10-02 NOTE — Progress Notes (Signed)
BP 114/72   Pulse 63   Ht 5\' 6"  (1.676 m)   Wt 176 lb (79.8 kg)   SpO2 96%   BMI 28.41 kg/m    Subjective:   Patient ID: David Randall, male    DOB: 01-Aug-1946, 78 y.o.   MRN: [id number]  HPI: David Randall is a 78 y.o. male presenting on 10/02/2023 for Medical Management of Chronic Issues and Hyperlipidemia   HPI Hyperlipidemia Patient is coming in for recheck of his hyperlipidemia. The patient is currently taking Zetia and fenofibrate. They deny any issues with myalgias or history of liver damage from it. They deny any focal numbness or weakness or chest pain.   GERD Patient is currently on omeprazole.  She denies any major symptoms or abdominal pain or belching or burping. She denies any blood in her stool or lightheadedness or dizziness.   Patient has been having cough and still having some difficulty breathing on exertion with his recovery from the flu.  He is feeling a lot better but it still just not completely where he like to be.  He denies any major shortness of breath or wheezing at rest  Relevant past medical, surgical, family and social history reviewed and updated as indicated. Interim medical history since our last visit reviewed. Allergies and medications reviewed and updated.  Review of Systems  Constitutional:  Negative for chills and fever.  HENT:  Positive for congestion.   Eyes:  Negative for visual disturbance.  Respiratory:  Positive for cough, shortness of breath and wheezing.   Cardiovascular:  Negative for chest pain and leg swelling.  Musculoskeletal:  Negative for back pain and gait problem.  Skin:  Negative for rash.  All other systems reviewed and are negative.   Per HPI unless specifically indicated above   Allergies as of 10/02/2023       Reactions   Niacin And Related Rash   Rash and itching   Statins Other (See Comments)   Myalgias, fatigue, depression        Medication List        Accurate as of October 02, 2023  2:20 PM. If  you have any questions, ask your nurse or doctor.          STOP taking these medications    budesonide-formoterol 160-4.5 MCG/ACT inhaler Commonly known as: SYMBICORT Stopped by: Elige Radon Yacqub Baston   fluticasone-salmeterol 115-21 MCG/ACT inhaler Commonly known as: ADVAIR HFA Stopped by: Elige Radon Marialuiza Car   moxifloxacin 400 MG tablet Commonly known as: AVELOX Stopped by: Elige Radon Keaira Whitehurst   Nexletol 180 MG Tabs Generic drug: Bempedoic Acid Stopped by: Elige Radon Elecia Serafin   oseltamivir 75 MG capsule Commonly known as: Tamiflu Stopped by: Elige Radon Samanda Buske   predniSONE 10 MG tablet Commonly known as: DELTASONE Stopped by: Elige Radon Hibah Odonnell       TAKE these medications    albuterol 108 (90 Base) MCG/ACT inhaler Commonly known as: VENTOLIN HFA INHALE TWO PUFFS BY MOUTH EVERY 6 HOURS AS NEEDED FOR WHEEZING OR SHORTNESS OF BREATH   b complex vitamins capsule Take 1 capsule by mouth daily.   clobetasol cream 0.05 % Commonly known as: TEMOVATE APPLY 2 TIMES DAILY AS NEEDED   ezetimibe 10 MG tablet Commonly known as: Zetia Take 1 tablet (10 mg total) by mouth daily.   fenofibrate 160 MG tablet Take 1 tablet (160 mg total) by mouth daily.   Fish Oil 1000 MG Caps Take by mouth. Takes 2 tablets daily  fluticasone 50 MCG/ACT nasal spray Commonly known as: FLONASE Place 2 sprays into both nostrils daily.   Glucosamine-Chondroitin 1500-1200 MG/30ML Liqd Take by mouth.   guaiFENesin-codeine 100-10 MG/5ML syrup Take 5 mLs by mouth every 6 (six) hours as needed for cough.   ipratropium 0.03 % nasal spray Commonly known as: ATROVENT Place 1 spray into both nostrils 2 (two) times daily.Can increase to two sprays in each nostril up to three times daily as needed,   loratadine 10 MG tablet Commonly known as: CLARITIN Take 10 mg by mouth daily.   LYCOPENE PO Take by mouth.   magnesium citrate Soln Take 1 Bottle by mouth once.   multivitamin capsule Take  1 capsule by mouth daily.   omeprazole 40 MG capsule Commonly known as: PRILOSEC Take 1 capsule (40 mg total) by mouth daily.   polycarbophil 625 MG tablet Commonly known as: FIBERCON Take 625 mg by mouth daily.   PROBIOTIC PO Take by mouth.         Objective:   BP 114/72   Pulse 63   Ht 5\' 6"  (1.676 m)   Wt 176 lb (79.8 kg)   SpO2 96%   BMI 28.41 kg/m   Wt Readings from Last 3 Encounters:  10/02/23 176 lb (79.8 kg)  04/08/23 165 lb (74.8 kg)  04/04/23 167 lb (75.8 kg)    Physical Exam Vitals and nursing note reviewed.  Constitutional:      General: He is not in acute distress.    Appearance: He is well-developed. He is not diaphoretic.  Eyes:     General: No scleral icterus.       Right eye: No discharge.     Conjunctiva/sclera: Conjunctivae normal.     Pupils: Pupils are equal, round, and reactive to light.  Neck:     Thyroid: No thyromegaly.  Cardiovascular:     Rate and Rhythm: Normal rate and regular rhythm.     Heart sounds: Normal heart sounds. No murmur heard. Pulmonary:     Effort: Pulmonary effort is normal. No respiratory distress.     Breath sounds: Normal breath sounds. No wheezing, rhonchi or rales.  Musculoskeletal:        General: Normal range of motion.     Cervical back: Neck supple.  Lymphadenopathy:     Cervical: No cervical adenopathy.  Skin:    General: Skin is warm and dry.     Findings: No rash.  Neurological:     Mental Status: He is alert and oriented to person, place, and time.     Coordination: Coordination normal.  Psychiatric:        Behavior: Behavior normal.     Results for orders placed or performed in visit on 09/29/23  PSA, total and free   Collection Time: 09/29/23  9:56 AM  Result Value Ref Range   Prostate Specific Ag, Serum 2.8 0.0 - 4.0 ng/mL   PSA, Free 0.60 N/A ng/mL   PSA, Free Pct 21.4 %  Lipid panel   Collection Time: 09/29/23  9:56 AM  Result Value Ref Range   Cholesterol, Total 176 100 - 199  mg/dL   Triglycerides 409 (H) 0 - 149 mg/dL   HDL 23 (L) >81 mg/dL   VLDL Cholesterol Cal 30 5 - 40 mg/dL   LDL Chol Calc (NIH) 191 (H) 0 - 99 mg/dL   Chol/HDL Ratio 7.7 (H) 0.0 - 5.0 ratio  CMP14+EGFR   Collection Time: 09/29/23  9:56 AM  Result Value  Ref Range   Glucose 84 70 - 99 mg/dL   BUN 20 8 - 27 mg/dL   Creatinine, Ser 3.24 0.76 - 1.27 mg/dL   eGFR 81 >40 NU/UVO/5.36   BUN/Creatinine Ratio 21 10 - 24   Sodium 139 134 - 144 mmol/L   Potassium 5.2 3.5 - 5.2 mmol/L   Chloride 102 96 - 106 mmol/L   CO2 25 20 - 29 mmol/L   Calcium 9.6 8.6 - 10.2 mg/dL   Total Protein 6.8 6.0 - 8.5 g/dL   Albumin 4.2 3.8 - 4.8 g/dL   Globulin, Total 2.6 1.5 - 4.5 g/dL   Bilirubin Total 0.4 0.0 - 1.2 mg/dL   Alkaline Phosphatase 48 44 - 121 IU/L   AST 22 0 - 40 IU/L   ALT 22 0 - 44 IU/L  CBC with Differential/Platelet   Collection Time: 09/29/23  9:56 AM  Result Value Ref Range   WBC 7.6 3.4 - 10.8 x10E3/uL   RBC 5.46 4.14 - 5.80 x10E6/uL   Hemoglobin 15.4 13.0 - 17.7 g/dL   Hematocrit 64.4 03.4 - 51.0 %   MCV 85 79 - 97 fL   MCH 28.2 26.6 - 33.0 pg   MCHC 33.1 31.5 - 35.7 g/dL   RDW 74.2 59.5 - 63.8 %   Platelets 349 150 - 450 x10E3/uL   Neutrophils 54 Not Estab. %   Lymphs 30 Not Estab. %   Monocytes 10 Not Estab. %   Eos 4 Not Estab. %   Basos 1 Not Estab. %   Neutrophils Absolute 4.1 1.4 - 7.0 x10E3/uL   Lymphocytes Absolute 2.3 0.7 - 3.1 x10E3/uL   Monocytes Absolute 0.8 0.1 - 0.9 x10E3/uL   EOS (ABSOLUTE) 0.3 0.0 - 0.4 x10E3/uL   Basophils Absolute 0.1 0.0 - 0.2 x10E3/uL   Immature Granulocytes 1 Not Estab. 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 and see if it helps in  recovery from the illness that has been recovering from.  Cholesterol improved, still elevated but improved.  He said he could not take the Nexletol.  Follow up plan: Return in about 6 months (around 03/31/2024), or if symptoms worsen or fail to improve, for hyperlipidemia and GERD.  Counseling provided for all of the vaccine components No orders of the defined types were placed in this encounter.   Arville Care, MD [facility] 10/02/2023, 2:20 PM

## 2023-10-02 NOTE — Addendum Note (Signed)
Addended by: Dorene Sorrow on: 10/02/2023 03:43 PM   Modules accepted: Orders

## 2023-10-14 ENCOUNTER — Telehealth: Payer: Self-pay | Admitting: Family Medicine

## 2023-10-20 NOTE — Telephone Encounter (Signed)
 Aware forms ready.

## 2023-10-22 ENCOUNTER — Other Ambulatory Visit (HOSPITAL_COMMUNITY): Payer: Self-pay

## 2023-10-22 ENCOUNTER — Other Ambulatory Visit (HOSPITAL_BASED_OUTPATIENT_CLINIC_OR_DEPARTMENT_OTHER): Payer: Self-pay

## 2023-10-23 ENCOUNTER — Other Ambulatory Visit (HOSPITAL_COMMUNITY): Payer: Self-pay

## 2023-10-23 ENCOUNTER — Other Ambulatory Visit: Payer: Self-pay | Admitting: Family Medicine

## 2023-10-24 ENCOUNTER — Other Ambulatory Visit (HOSPITAL_COMMUNITY): Payer: Self-pay

## 2023-10-24 ENCOUNTER — Other Ambulatory Visit: Payer: Self-pay

## 2023-10-24 MED ORDER — EZETIMIBE 10 MG PO TABS
10.0000 mg | ORAL_TABLET | Freq: Every day | ORAL | 3 refills | Status: AC
  Filled 2023-10-24: qty 90, 90d supply, fill #0
  Filled 2024-01-17 – 2024-01-20 (×2): qty 90, 90d supply, fill #1
  Filled 2024-04-17: qty 90, 90d supply, fill #2
  Filled 2024-07-16: qty 90, 90d supply, fill #3

## 2023-10-27 ENCOUNTER — Other Ambulatory Visit (HOSPITAL_COMMUNITY): Payer: Self-pay

## 2023-10-27 ENCOUNTER — Other Ambulatory Visit: Payer: Self-pay

## 2023-10-29 DIAGNOSIS — L570 Actinic keratosis: Secondary | ICD-10-CM | POA: Diagnosis not present

## 2023-10-29 DIAGNOSIS — X32XXXA Exposure to sunlight, initial encounter: Secondary | ICD-10-CM | POA: Diagnosis not present

## 2023-10-29 DIAGNOSIS — C44329 Squamous cell carcinoma of skin of other parts of face: Secondary | ICD-10-CM | POA: Diagnosis not present

## 2023-10-29 DIAGNOSIS — L821 Other seborrheic keratosis: Secondary | ICD-10-CM | POA: Diagnosis not present

## 2023-10-29 DIAGNOSIS — L119 Acantholytic disorder, unspecified: Secondary | ICD-10-CM | POA: Diagnosis not present

## 2023-10-29 DIAGNOSIS — D225 Melanocytic nevi of trunk: Secondary | ICD-10-CM | POA: Diagnosis not present

## 2023-11-19 ENCOUNTER — Other Ambulatory Visit (HOSPITAL_COMMUNITY): Payer: Self-pay

## 2023-11-19 ENCOUNTER — Other Ambulatory Visit: Payer: Self-pay

## 2023-11-26 ENCOUNTER — Other Ambulatory Visit: Payer: Self-pay

## 2023-11-26 ENCOUNTER — Other Ambulatory Visit (HOSPITAL_COMMUNITY): Payer: Self-pay

## 2023-11-27 DIAGNOSIS — L570 Actinic keratosis: Secondary | ICD-10-CM | POA: Diagnosis not present

## 2023-11-27 DIAGNOSIS — X32XXXD Exposure to sunlight, subsequent encounter: Secondary | ICD-10-CM | POA: Diagnosis not present

## 2023-11-27 DIAGNOSIS — Z85828 Personal history of other malignant neoplasm of skin: Secondary | ICD-10-CM | POA: Diagnosis not present

## 2023-11-27 DIAGNOSIS — Z08 Encounter for follow-up examination after completed treatment for malignant neoplasm: Secondary | ICD-10-CM | POA: Diagnosis not present

## 2023-12-20 ENCOUNTER — Other Ambulatory Visit (HOSPITAL_COMMUNITY): Payer: Self-pay

## 2023-12-23 ENCOUNTER — Other Ambulatory Visit (HOSPITAL_COMMUNITY): Payer: Self-pay

## 2023-12-24 ENCOUNTER — Other Ambulatory Visit (HOSPITAL_COMMUNITY): Payer: Self-pay

## 2024-01-19 ENCOUNTER — Other Ambulatory Visit (HOSPITAL_COMMUNITY): Payer: Self-pay

## 2024-01-20 ENCOUNTER — Other Ambulatory Visit (HOSPITAL_COMMUNITY): Payer: Self-pay

## 2024-01-22 ENCOUNTER — Other Ambulatory Visit (HOSPITAL_COMMUNITY): Payer: Self-pay

## 2024-01-30 ENCOUNTER — Other Ambulatory Visit (HOSPITAL_COMMUNITY): Payer: Self-pay

## 2024-03-02 ENCOUNTER — Other Ambulatory Visit (HOSPITAL_COMMUNITY): Payer: Self-pay

## 2024-03-03 ENCOUNTER — Other Ambulatory Visit (HOSPITAL_COMMUNITY): Payer: Self-pay

## 2024-03-04 ENCOUNTER — Other Ambulatory Visit (HOSPITAL_COMMUNITY): Payer: Self-pay

## 2024-03-04 ENCOUNTER — Other Ambulatory Visit: Payer: Self-pay

## 2024-03-04 ENCOUNTER — Other Ambulatory Visit: Payer: Self-pay | Admitting: *Deleted

## 2024-03-04 DIAGNOSIS — R058 Other specified cough: Secondary | ICD-10-CM

## 2024-03-04 MED ORDER — FLUTICASONE PROPIONATE 50 MCG/ACT NA SUSP
2.0000 | Freq: Every day | NASAL | 1 refills | Status: AC
  Filled 2024-03-04: qty 48, 90d supply, fill #0
  Filled 2024-07-31: qty 48, 90d supply, fill #1

## 2024-03-26 ENCOUNTER — Telehealth: Payer: Self-pay

## 2024-03-26 DIAGNOSIS — E782 Mixed hyperlipidemia: Secondary | ICD-10-CM

## 2024-03-26 NOTE — Telephone Encounter (Signed)
 Copied from CRM #[id number]. Topic: Clinical - Request for Lab/Test Order >> Mar 26, 2024  9:57 AM Tobias CROME wrote: Reason for CRM: Patient needing to complete labs before visit with next Wednesday 03/31/24 with Dr. Maryanne.   Patient requesting orders be placed and a call back to schedule lab appointment, [phone]

## 2024-03-26 NOTE — Telephone Encounter (Signed)
 Lab appt made for 8/18 at 9:45am. Orders placed. Pt aware.

## 2024-03-26 NOTE — Addendum Note (Signed)
 Addended by: LEIGH ROSINA SAILOR on: 03/26/2024 03:04 PM   Modules accepted: Orders

## 2024-03-29 ENCOUNTER — Other Ambulatory Visit

## 2024-03-29 DIAGNOSIS — E782 Mixed hyperlipidemia: Secondary | ICD-10-CM

## 2024-03-29 LAB — CBC WITH DIFFERENTIAL/PLATELET
Basophils Absolute: 0.1 x10E3/uL (ref 0.0–0.2)
Basos: 1 %
EOS (ABSOLUTE): 0.2 x10E3/uL (ref 0.0–0.4)
Eos: 3 %
Hematocrit: 46.2 % (ref 37.5–51.0)
Hemoglobin: 15.1 g/dL (ref 13.0–17.7)
Immature Grans (Abs): 0 x10E3/uL (ref 0.0–0.1)
Immature Granulocytes: 0 %
Lymphocytes Absolute: 1.8 x10E3/uL (ref 0.7–3.1)
Lymphs: 27 %
MCH: 28.4 pg (ref 26.6–33.0)
MCHC: 32.7 g/dL (ref 31.5–35.7)
MCV: 87 fL (ref 79–97)
Monocytes Absolute: 0.7 x10E3/uL (ref 0.1–0.9)
Monocytes: 10 %
Neutrophils Absolute: 4.1 x10E3/uL (ref 1.4–7.0)
Neutrophils: 59 %
Platelets: 313 x10E3/uL (ref 150–450)
RBC: 5.31 x10E6/uL (ref 4.14–5.80)
RDW: 13.2 % (ref 11.6–15.4)
WBC: 6.9 x10E3/uL (ref 3.4–10.8)

## 2024-03-29 LAB — LIPID PANEL
Chol/HDL Ratio: 7.1 ratio — ABNORMAL HIGH (ref 0.0–5.0)
Cholesterol, Total: 200 mg/dL — ABNORMAL HIGH (ref 100–199)
HDL: 28 mg/dL — ABNORMAL LOW (ref >39)
LDL Chol Calc (NIH): 149 mg/dL — ABNORMAL HIGH (ref 0–99)
Triglycerides: 126 mg/dL (ref 0–149)
VLDL Cholesterol Cal: 23 mg/dL (ref 5–40)

## 2024-03-29 LAB — CMP14+EGFR
ALT: 19 IU/L (ref 0–44)
AST: 24 IU/L (ref 0–40)
Albumin: 4.3 g/dL (ref 3.8–4.8)
Alkaline Phosphatase: 45 IU/L (ref 44–121)
BUN/Creatinine Ratio: 14 (ref 10–24)
BUN: 16 mg/dL (ref 8–27)
Bilirubin Total: 0.5 mg/dL (ref 0.0–1.2)
CO2: 23 mmol/L (ref 20–29)
Calcium: 9.9 mg/dL (ref 8.6–10.2)
Chloride: 102 mmol/L (ref 96–106)
Creatinine, Ser: 1.17 mg/dL (ref 0.76–1.27)
Globulin, Total: 2.5 g/dL (ref 1.5–4.5)
Glucose: 93 mg/dL (ref 70–99)
Potassium: 4.3 mmol/L (ref 3.5–5.2)
Sodium: 139 mmol/L (ref 134–144)
Total Protein: 6.8 g/dL (ref 6.0–8.5)
eGFR: 65 mL/min/1.73 (ref >59)

## 2024-03-31 ENCOUNTER — Ambulatory Visit (INDEPENDENT_AMBULATORY_CARE_PROVIDER_SITE_OTHER): Payer: HMO | Admitting: Family Medicine

## 2024-03-31 ENCOUNTER — Encounter: Payer: Self-pay | Admitting: Family Medicine

## 2024-03-31 VITALS — BP 121/63 | HR 57 | Temp 98.0°F | Ht 66.0 in | Wt 169.0 lb

## 2024-03-31 DIAGNOSIS — Z8042 Family history of malignant neoplasm of prostate: Secondary | ICD-10-CM

## 2024-03-31 DIAGNOSIS — K219 Gastro-esophageal reflux disease without esophagitis: Secondary | ICD-10-CM | POA: Diagnosis not present

## 2024-03-31 DIAGNOSIS — E782 Mixed hyperlipidemia: Secondary | ICD-10-CM

## 2024-03-31 NOTE — Progress Notes (Signed)
 BP 121/63   Pulse (!) 57   Temp 98 F (36.7 C)   Ht 5' 6 (1.676 m)   Wt 169 lb (76.7 kg)   SpO2 94%   BMI 27.28 kg/m    Subjective:   Patient ID: David Randall, male    DOB: June 01, 1946, 78 y.o.   MRN: [id number]  HPI: David Randall is a 78 y.o. male presenting on 03/31/2024 for Medical Management of Chronic Issues   Discussed the use of AI scribe software for clinical note transcription with the patient, who gave verbal consent to proceed.  History of Present Illness   David Randall is a 78 year old male who presents for a follow-up visit to recheck his cholesterol levels.  He is currently taking Zetia , fenofibrate , and fish oils for hyperlipidemia. He has been consistent with these medications. He had only recently started taking Zetia  consistently due to previous side effects from another medication, which he wanted to clear from his system before starting Zetia .  He discontinued Breztri  due to hoarseness and difficulty communicating. He uses albuterol  as a rescue inhaler, which he has not needed much this summer. He finds relief from nasal congestion with Flonase  and Atrovent  nasal sprays, using them once daily or as needed for runny nose. No recent issues with wheezing or breathing difficulties.  For gastroesophageal reflux disease, he continues to take omeprazole  and reports doing well with it.         Relevant past medical, surgical, family and social history reviewed and updated as indicated. Interim medical history since our last visit reviewed. Allergies and medications reviewed and updated.  Review of Systems  Constitutional:  Negative for chills and fever.  Eyes:  Negative for visual disturbance.  Respiratory:  Negative for shortness of breath and wheezing.   Cardiovascular:  Negative for chest pain and leg swelling.  Musculoskeletal:  Negative for back pain and gait problem.  Skin:  Negative for rash.  Neurological:  Negative for dizziness and  light-headedness.  All other systems reviewed and are negative.   Per HPI unless specifically indicated above   Allergies as of 03/31/2024       Reactions   Niacin And Related Rash   Rash and itching   Statins Other (See Comments)   Myalgias, fatigue, depression        Medication List        Accurate as of March 31, 2024  9:56 AM. If you have any questions, ask your nurse or doctor.          albuterol  108 (90 Base) MCG/ACT inhaler Commonly known as: VENTOLIN  HFA INHALE TWO PUFFS BY MOUTH EVERY 6 HOURS AS NEEDED FOR WHEEZING OR SHORTNESS OF BREATH   b complex vitamins capsule Take 1 capsule by mouth daily.   Breztri  Aerosphere 160-9-4.8 MCG/ACT Aero inhaler Generic drug: budesonide -glycopyrrolate-formoterol  Inhale 2 puffs into the lungs 2 (two) times daily.   clobetasol  cream 0.05 % Commonly known as: TEMOVATE  APPLY 2 TIMES DAILY AS NEEDED   ezetimibe  10 MG tablet Commonly known as: Zetia  Take 1 tablet (10 mg total) by mouth daily.   fenofibrate  160 MG tablet Take 1 tablet (160 mg total) by mouth daily.   Fish Oil 1000 MG Caps Take by mouth. Takes 2 tablets daily   fluticasone  50 MCG/ACT nasal spray Commonly known as: FLONASE  Place 2 sprays into both nostrils daily.   Glucosamine-Chondroitin 1500-1200 MG/30ML Liqd Take by mouth.   guaiFENesin -codeine  100-10 MG/5ML syrup Take 5 mLs by  mouth every 6 (six) hours as needed for cough.   ipratropium 0.03 % nasal spray Commonly known as: ATROVENT  Place 1 spray into both nostrils 2 (two) times daily.Can increase to two sprays in each nostril up to three times daily as needed,   loratadine 10 MG tablet Commonly known as: CLARITIN Take 10 mg by mouth daily.   LYCOPENE PO Take by mouth.   magnesium citrate Soln Take 1 Bottle by mouth once.   multivitamin capsule Take 1 capsule by mouth daily.   omeprazole  40 MG capsule Commonly known as: PRILOSEC Take 1 capsule (40 mg total) by mouth daily.    polycarbophil 625 MG tablet Commonly known as: FIBERCON Take 625 mg by mouth daily.   PROBIOTIC PO Take by mouth.         Objective:   BP 121/63   Pulse (!) 57   Temp 98 F (36.7 C)   Ht 5' 6 (1.676 m)   Wt 169 lb (76.7 kg)   SpO2 94%   BMI 27.28 kg/m   Wt Readings from Last 3 Encounters:  03/31/24 169 lb (76.7 kg)  10/02/23 176 lb (79.8 kg)  04/08/23 165 lb (74.8 kg)    Physical Exam Physical Exam   NECK: Thyroid  normal, no lumps. CHEST: Lungs clear to auscultation bilaterally. CARDIOVASCULAR: Heart sounds normal. EXTREMITIES: No edema in legs.       Results for orders placed or performed in visit on 03/29/24  Lipid panel   Collection Time: 03/29/24  9:57 AM  Result Value Ref Range   Cholesterol, Total 200 (H) 100 - 199 mg/dL   Triglycerides 873 0 - 149 mg/dL   HDL 28 (L) >60 mg/dL   VLDL Cholesterol Cal 23 5 - 40 mg/dL   LDL Chol Calc (NIH) 850 (H) 0 - 99 mg/dL   Chol/HDL Ratio 7.1 (H) 0.0 - 5.0 ratio  CMP14+EGFR   Collection Time: 03/29/24  9:57 AM  Result Value Ref Range   Glucose 93 70 - 99 mg/dL   BUN 16 8 - 27 mg/dL   Creatinine, Ser 8.82 0.76 - 1.27 mg/dL   eGFR 65 >40 fO/fpw/8.26   BUN/Creatinine Ratio 14 10 - 24   Sodium 139 134 - 144 mmol/L   Potassium 4.3 3.5 - 5.2 mmol/L   Chloride 102 96 - 106 mmol/L   CO2 23 20 - 29 mmol/L   Calcium 9.9 8.6 - 10.2 mg/dL   Total Protein 6.8 6.0 - 8.5 g/dL   Albumin 4.3 3.8 - 4.8 g/dL   Globulin, Total 2.5 1.5 - 4.5 g/dL   Bilirubin Total 0.5 0.0 - 1.2 mg/dL   Alkaline Phosphatase 45 44 - 121 IU/L   AST 24 0 - 40 IU/L   ALT 19 0 - 44 IU/L  CBC with Differential/Platelet   Collection Time: 03/29/24  9:57 AM  Result Value Ref Range   WBC 6.9 3.4 - 10.8 x10E3/uL   RBC 5.31 4.14 - 5.80 x10E6/uL   Hemoglobin 15.1 13.0 - 17.7 g/dL   Hematocrit 53.7 62.4 - 51.0 %   MCV 87 79 - 97 fL   MCH 28.4 26.6 - 33.0 pg   MCHC 32.7 31.5 - 35.7 g/dL   RDW 86.7 88.3 - 84.5 %   Platelets 313 150 - 450 x10E3/uL    Neutrophils 59 Not Estab. %   Lymphs 27 Not Estab. %   Monocytes 10 Not Estab. %   Eos 3 Not Estab. %   Basos 1 Not  Estab. %   Neutrophils Absolute 4.1 1.4 - 7.0 x10E3/uL   Lymphocytes Absolute 1.8 0.7 - 3.1 x10E3/uL   Monocytes Absolute 0.7 0.1 - 0.9 x10E3/uL   EOS (ABSOLUTE) 0.2 0.0 - 0.4 x10E3/uL   Basophils Absolute 0.1 0.0 - 0.2 x10E3/uL   Immature Granulocytes 0 Not Estab. %   Immature Grans (Abs) 0.0 0.0 - 0.1 x10E3/uL    Assessment & Plan:   Problem List Items Addressed This Visit       Digestive   GERD (gastroesophageal reflux disease)     Other   Hyperlipidemia - Primary   Family history of prostate cancer        Hyperlipidemia Mixed response to treatment. Triglycerides improved with fenofibrate  and fish oils, but LDL worsened with Zetia . Considering Nexletol  and Repatha due to cost concerns. - Continue Zetia  and reassess in six months. - Consider Nexletol  or Repatha if LDL levels do not improve and insurance coverage is available. - Re-evaluate lipid panel in six months.  Chronic rhinitis Symptoms well-controlled with Flonase  and Atrovent  nasal sprays. - Continue Flonase  and Atrovent  nasal sprays as needed.  Asthma Good symptom control with albuterol  rescue inhaler. Nasal sprays aiding congestion. - Continue albuterol  rescue inhaler as needed. - Monitor symptoms, especially during winter months, and consider alternative treatments if symptoms worsen.  Gastroesophageal reflux disease (GERD) Well-controlled with omeprazole . - Continue omeprazole .          Follow up plan: Return in about 6 months (around 10/01/2024), or if symptoms worsen or fail to improve, for Physical exam.  Counseling provided for all of the vaccine components No orders of the defined types were placed in this encounter.   Fonda Levins, MD [facility] 03/31/2024, 9:56 AM

## 2024-04-17 ENCOUNTER — Other Ambulatory Visit: Payer: Self-pay | Admitting: Family Medicine

## 2024-04-17 DIAGNOSIS — E782 Mixed hyperlipidemia: Secondary | ICD-10-CM

## 2024-04-19 ENCOUNTER — Other Ambulatory Visit: Payer: Self-pay

## 2024-04-19 MED ORDER — FENOFIBRATE 160 MG PO TABS
160.0000 mg | ORAL_TABLET | Freq: Every day | ORAL | 1 refills | Status: AC
  Filled 2024-04-19: qty 90, 90d supply, fill #0
  Filled 2024-07-16: qty 90, 90d supply, fill #1

## 2024-05-10 ENCOUNTER — Other Ambulatory Visit: Payer: Self-pay

## 2024-05-10 ENCOUNTER — Other Ambulatory Visit: Payer: Self-pay | Admitting: Family Medicine

## 2024-05-10 DIAGNOSIS — K219 Gastro-esophageal reflux disease without esophagitis: Secondary | ICD-10-CM

## 2024-05-10 MED ORDER — OMEPRAZOLE 40 MG PO CPDR
40.0000 mg | DELAYED_RELEASE_CAPSULE | Freq: Every day | ORAL | 1 refills | Status: AC
  Filled 2024-05-10: qty 90, 90d supply, fill #0
  Filled 2024-08-08: qty 90, 90d supply, fill #1

## 2024-05-27 DIAGNOSIS — L570 Actinic keratosis: Secondary | ICD-10-CM | POA: Diagnosis not present

## 2024-05-27 DIAGNOSIS — Z85828 Personal history of other malignant neoplasm of skin: Secondary | ICD-10-CM | POA: Diagnosis not present

## 2024-05-27 DIAGNOSIS — X32XXXD Exposure to sunlight, subsequent encounter: Secondary | ICD-10-CM | POA: Diagnosis not present

## 2024-05-27 DIAGNOSIS — Z08 Encounter for follow-up examination after completed treatment for malignant neoplasm: Secondary | ICD-10-CM | POA: Diagnosis not present

## 2024-06-08 ENCOUNTER — Other Ambulatory Visit (HOSPITAL_COMMUNITY): Payer: Self-pay

## 2024-06-16 ENCOUNTER — Ambulatory Visit (INDEPENDENT_AMBULATORY_CARE_PROVIDER_SITE_OTHER): Payer: Self-pay

## 2024-06-16 VITALS — BP 121/63 | HR 57 | Ht 66.0 in | Wt 169.0 lb

## 2024-06-16 DIAGNOSIS — Z0000 Encounter for general adult medical examination without abnormal findings: Secondary | ICD-10-CM

## 2024-06-16 NOTE — Progress Notes (Signed)
 Subjective:   David Randall is a 78 y.o. male who presents for a Medicare Annual Wellness Visit.  I connected with  David Randall on 06/16/24 by a audio enabled telemedicine application and verified that I am speaking with the correct person using two identifiers.  Patient Location: Home  Provider Location: Office/Clinic  I discussed the limitations of evaluation and management by telemedicine. The patient expressed understanding and agreed to proceed.   Allergies (verified) Niacin and related and Statins   History: Past Medical History:  Diagnosis Date   Allergy    Bradycardia    GERD (gastroesophageal reflux disease)    Hemorrhoids    Hyperlipidemia    Inguinal hernia    Past Surgical History:  Procedure Laterality Date   COLONOSCOPY N/A 02/26/2016   Procedure: COLONOSCOPY;  Surgeon: Margo LITTIE Haddock, MD;  Location: [facility];  Service: Endoscopy;  Laterality: N/A;  8:30 Am   HERNIA REPAIR     TONSILLECTOMY  08/13/1967   TONSILLECTOMY  1967   Family History  Problem Relation Age of Onset   Cancer Mother        breast, bone   Heart disease Father    Prostate cancer Brother    Cancer Brother        prostate   Cancer Sister        breast   COPD Sister    Social History   Occupational History   Not on file  Tobacco Use   Smoking status: Former    Current packs/day: 0.00    Types: Pipe, Cigarettes    Quit date: 08/12/1984    Years since quitting: 39.8   Smokeless tobacco: Never  Vaping Use   Vaping status: Never Used  Substance and Sexual Activity   Alcohol use: No   Drug use: No   Sexual activity: Not Currently   Tobacco Counseling Counseling given: Yes  SDOH Screenings   Food Insecurity: No Food Insecurity (06/12/2024)  Housing: Low Risk  (06/12/2024)  Transportation Needs: No Transportation Needs (06/12/2024)  Utilities: Not At Risk (06/16/2024)  Alcohol Screen: Low Risk  (04/08/2023)  Depression (PHQ2-9): Low Risk  (06/16/2024)  Financial Resource  Strain: Low Risk  (06/12/2024)  Physical Activity: Sufficiently Active (06/12/2024)  Social Connections: Moderately Isolated (06/12/2024)  Stress: No Stress Concern Present (06/16/2024)  Tobacco Use: Medium Risk (06/16/2024)  Health Literacy: Adequate Health Literacy (06/16/2024)   Depression Screen    06/16/2024   10:54 AM 03/31/2024    9:25 AM 10/02/2023    2:02 PM 04/08/2023    1:07 PM 04/04/2023    8:22 AM 09/30/2022    9:30 AM 05/09/2022    3:52 PM  PHQ 2/9 Scores  PHQ - 2 Score 0 0 0 0 0 2 2  PHQ- 9 Score    0  5 5     Goals Addressed             This Visit's Progress    Patient Stated   On track    Hopes to stay healthy and active       Visit info / Clinical Intake: Medicare Wellness Visit Type:: Subsequent Annual Wellness Visit Medicare Wellness Visit Mode:: Telephone If telephone:: video declined If telephone or video:: vitals recorded from last visit Interpreter Needed?: No Pre-visit prep was completed: yes AWV questionnaire completed by patient prior to visit?: yes Date:: 06/12/24 Living arrangements:: lives with spouse/significant other Patient's Overall Health Status Rating: very good Typical amount of pain: none Does  pain affect daily life?: no Are you currently prescribed opioids?: no  Dietary Habits and Nutritional Risks How many meals a day?: 3 Eats fruit and vegetables daily?: yes Most meals are obtained by: preparing own meals Diabetic:: no  Functional Status Activities of Daily Living (to include ambulation/medication): (Patient-Rptd) Independent Ambulation: (Patient-Rptd) Independent Home Management: (Patient-Rptd) Independent Manage your own finances?: yes Primary transportation is: driving Concerns about hearing?: no  Fall Screening Falls in the past year?: (Patient-Rptd) 0 Number of falls in past year: 0 Was there an injury with Fall?: 0 Fall Risk Category Calculator: 0 Patient Fall Risk Level: Low Fall Risk  Fall Risk Patient at Risk  for Falls Due to: No Fall Risks Fall risk Follow up: Falls evaluation completed; Education provided  Home and Transportation Safety: All rugs have non-skid backing?: yes All stairs or steps have railings?: yes Grab bars in the bathtub or shower?: yes Have non-skid surface in bathtub or shower?: yes Good home lighting?: yes Regular seat belt use?: yes Hospital stays in the last year:: no  Cognitive Assessment Difficulty concentrating, remembering, or making decisions? : no Will 6CIT or Mini Cog be Completed: yes What year is it?: 0 points What month is it?: 0 points Give patient an address phrase to remember (5 components): [address] [place], [place] About what time is it?: 0 points Count backwards from 20 to 1: 0 points Say the months of the year in reverse: 0 points Repeat the address phrase from earlier: 0 points 6 CIT Score: 0 points  Advance Directives (For Healthcare) Does Patient Have a Medical Advance Directive?: No; Yes Type of Advance Directive: Healthcare Power of Douds; Living will  Reviewed/Updated  Reviewed/Updated: All; Medical History; Surgical History; Family History; Medications; Allergies; Care Teams; Patient Goals        Objective:    Today's Vitals   06/16/24 1046  BP: 121/63  Pulse: (!) 57  Weight: 169 lb (76.7 kg)  Height: 5' 6 (1.676 m)   Body mass index is 27.28 kg/m.  Current Medications (verified) Outpatient Encounter Medications as of 06/16/2024  Medication Sig   albuterol  (VENTOLIN  HFA) 108 (90 Base) MCG/ACT inhaler INHALE TWO PUFFS BY MOUTH EVERY 6 HOURS AS NEEDED FOR WHEEZING OR SHORTNESS OF BREATH   b complex vitamins capsule Take 1 capsule by mouth daily.   Budeson-Glycopyrrol-Formoterol  (BREZTRI  AEROSPHERE) 160-9-4.8 MCG/ACT AERO Inhale 2 puffs into the lungs 2 (two) times daily.   clobetasol  cream (TEMOVATE ) 0.05 % APPLY 2 TIMES DAILY AS NEEDED   ezetimibe  (ZETIA ) 10 MG tablet Take 1 tablet (10 mg total) by mouth daily.    fenofibrate  160 MG tablet Take 1 tablet (160 mg total) by mouth daily.   fluticasone  (FLONASE ) 50 MCG/ACT nasal spray Place 2 sprays into both nostrils daily.   Glucosamine-Chondroitin 1500-1200 MG/30ML LIQD Take by mouth.     guaiFENesin -codeine  100-10 MG/5ML syrup Take 5 mLs by mouth every 6 (six) hours as needed for cough.   ipratropium (ATROVENT ) 0.03 % nasal spray Place 1 spray into both nostrils 2 (two) times daily.Can increase to two sprays in each nostril up to three times daily as needed,   loratadine (CLARITIN) 10 MG tablet Take 10 mg by mouth daily.   LYCOPENE PO Take by mouth.   magnesium citrate SOLN Take 1 Bottle by mouth once.   Multiple Vitamin (MULTIVITAMIN) capsule Take 1 capsule by mouth daily.     Omega-3 Fatty Acids (FISH OIL) 1000 MG CAPS Take by mouth. Takes 2 tablets  daily   omeprazole  (PRILOSEC) 40 MG capsule Take 1 capsule (40 mg total) by mouth daily.   polycarbophil (FIBERCON) 625 MG tablet Take 625 mg by mouth daily.     Probiotic Product (PROBIOTIC PO) Take by mouth.     No facility-administered encounter medications on file as of 06/16/2024.   Hearing/Vision screen Hearing Screening - Comments:: Pt denies hearing dif Immunizations and Health Maintenance Health Maintenance  Topic Date Due   Influenza Vaccine  03/12/2024   COVID-19 Vaccine (5 - 2025-26 season) 04/12/2024   Medicare Annual Wellness (AWV)  06/16/2025   DTaP/Tdap/Td (2 - Td or Tdap) 04/20/2028   Pneumococcal Vaccine: 50+ Years  Completed   Hepatitis C Screening  Completed   Zoster Vaccines- Shingrix  Completed   Meningococcal B Vaccine  Aged Out   Colonoscopy  Discontinued        Assessment/Plan:  This is a routine wellness examination for David Randall.  Patient Care Team: Dettinger, Fonda LABOR, MD as PCP - General (Family Medicine) Debera Jayson MATSU, MD as Consulting Physician (Cardiology) Harvey Margo CROME, MD (Inactive) as Consulting Physician (Gastroenterology) Vicci Mcardle, OD  (Optometry)  I have personally reviewed and noted the following in the patient's chart:   Medical and social history Use of alcohol, tobacco or illicit drugs  Current medications and supplements including opioid prescriptions. Functional ability and status Nutritional status Physical activity Advanced directives List of other physicians Hospitalizations, surgeries, and ER visits in previous 12 months Vitals Screenings to include cognitive, depression, and falls Referrals and appointments  No orders of the defined types were placed in this encounter.  In addition, I have reviewed and discussed with patient certain preventive protocols, quality metrics, and best practice recommendations. A written personalized care plan for preventive services as well as general preventive health recommendations were provided to patient.   David Randall, CMA   06/16/2024   Return in 1 year (on 06/16/2025).  After Visit Summary: (MyChart) Due to this being a telephonic visit, the after visit summary with patients personalized plan was offered to patient via MyChart   Nurse Notes: PCP Follow: during awv c/o noticing recent change in having shortness of breath in the last few months since last pcp visit. Per pt said it seems to be getting worst, suggest pt call and make an appt w/pcp. Also to seek help if he should need to. Pt agree and will call pcp

## 2024-06-30 ENCOUNTER — Other Ambulatory Visit: Payer: Self-pay | Admitting: Family Medicine

## 2024-06-30 ENCOUNTER — Other Ambulatory Visit (HOSPITAL_COMMUNITY): Payer: Self-pay

## 2024-07-01 ENCOUNTER — Other Ambulatory Visit: Payer: Self-pay

## 2024-07-01 ENCOUNTER — Other Ambulatory Visit (HOSPITAL_COMMUNITY): Payer: Self-pay

## 2024-07-01 MED ORDER — IPRATROPIUM BROMIDE 0.03 % NA SOLN
1.0000 | Freq: Two times a day (BID) | NASAL | 3 refills | Status: AC
  Filled 2024-07-01: qty 30, 30d supply, fill #0
  Filled 2024-07-01: qty 30, 29d supply, fill #0
  Filled 2024-07-31: qty 30, 29d supply, fill #1

## 2024-07-02 ENCOUNTER — Other Ambulatory Visit (HOSPITAL_COMMUNITY): Payer: Self-pay

## 2024-08-01 ENCOUNTER — Other Ambulatory Visit (HOSPITAL_COMMUNITY): Payer: Self-pay

## 2024-09-27 ENCOUNTER — Other Ambulatory Visit

## 2024-10-01 ENCOUNTER — Encounter: Payer: Self-pay | Admitting: Family Medicine

## 2025-06-20 ENCOUNTER — Ambulatory Visit
